# Patient Record
Sex: Male | Born: 1995 | Race: Black or African American | Hispanic: No | Marital: Single | Smoking: Never smoker
Health system: Southern US, Community
[De-identification: ages and names within clinical notes are randomized; demographics above are authoritative.]

## PROBLEM LIST (undated history)

## (undated) DIAGNOSIS — A048 Other specified bacterial intestinal infections: Secondary | ICD-10-CM

## (undated) DIAGNOSIS — U071 COVID-19: Secondary | ICD-10-CM

## (undated) HISTORY — DX: Other specified bacterial intestinal infections: A04.8

## (undated) HISTORY — PX: INGUINAL HERNIA REPAIR: SUR1180

---

## 2018-03-09 ENCOUNTER — Emergency Department (HOSPITAL_COMMUNITY): Payer: Self-pay

## 2018-03-09 ENCOUNTER — Other Ambulatory Visit: Payer: Self-pay

## 2018-03-09 ENCOUNTER — Emergency Department (HOSPITAL_COMMUNITY)
Admission: EM | Admit: 2018-03-09 | Discharge: 2018-03-09 | Disposition: A | Discharge status: Home / Self Care | Payer: Self-pay | Attending: Emergency Medicine | Admitting: Emergency Medicine

## 2018-03-09 ENCOUNTER — Encounter (HOSPITAL_COMMUNITY): Payer: Self-pay

## 2018-03-09 DIAGNOSIS — S0990XA Unspecified injury of head, initial encounter: Secondary | ICD-10-CM | POA: Insufficient documentation

## 2018-03-09 DIAGNOSIS — S20212A Contusion of left front wall of thorax, initial encounter: Secondary | ICD-10-CM | POA: Insufficient documentation

## 2018-03-09 DIAGNOSIS — Y9289 Other specified places as the place of occurrence of the external cause: Secondary | ICD-10-CM | POA: Insufficient documentation

## 2018-03-09 DIAGNOSIS — Y998 Other external cause status: Secondary | ICD-10-CM | POA: Insufficient documentation

## 2018-03-09 DIAGNOSIS — R071 Chest pain on breathing: Secondary | ICD-10-CM | POA: Insufficient documentation

## 2018-03-09 DIAGNOSIS — W11XXXA Fall on and from ladder, initial encounter: Secondary | ICD-10-CM | POA: Insufficient documentation

## 2018-03-09 DIAGNOSIS — R51 Headache: Secondary | ICD-10-CM | POA: Insufficient documentation

## 2018-03-09 DIAGNOSIS — Y93H9 Activity, other involving exterior property and land maintenance, building and construction: Secondary | ICD-10-CM | POA: Insufficient documentation

## 2018-03-09 MED ORDER — HYDROCODONE-ACETAMINOPHEN 5-325 MG PO TABS
2.0000 | ORAL_TABLET | Freq: Once | ORAL | Status: AC
  Administered 2018-03-09: 2 via ORAL
  Filled 2018-03-09: qty 2

## 2018-03-09 MED ORDER — MELOXICAM 7.5 MG PO TABS: 15.0000 mg | ORAL_TABLET | Freq: Every day | ORAL | 0 refills | Status: DC

## 2018-03-09 NOTE — ED Triage Notes (Addendum)
Pt states he fell off a 10+ foot tall ladder earlier today- he was rushing and missed a rung. Pt states he hit the right side of his face/head, and has left sided rib pain.

## 2018-03-09 NOTE — ED Notes (Signed)
Pt reports falling off of ladder and landed on left side with pain to left lateral rib area.

## 2018-03-09 NOTE — ED Provider Notes (Signed)
[facility]-EMERGENCY DEPT Provider Note   CSN: [id number] Arrival date & time: 03/09/18  [id number]     History   Chief Complaint Chief Complaint  Patient presents with  . Fall    HPI [name] is a 22 y.o. male.  HPI  The patient is a 22 year old male who was at work cleaning leaves out of the gutter when he had a fall on his ladder, he was trying to come down to quickly, Mr. rung and fell to the ground.  He states that he struck his left ribs as well as the right side of his head during the fall.  He did not lose consciousness, has not been nauseated or vomiting but did not immediately feel dizzy.  The dizziness has gone away and now he just has a diffuse headache.  He has pain with deep breathing in the left anterior lateral ribs.  He denies any abdominal pain and has no pain in his arms or legs.  The patient denies any changes in vision, no numbness or weakness, he is able to ambulate only with pain in the left ribs.  Symptoms are persistent, worse with palpation and breathing.  No associated hemoptysis  History reviewed. No pertinent past medical history.  There are no active problems to display for this patient.   History reviewed. No pertinent surgical history.      Home Medications    Prior to Admission medications   Medication Sig Start Date End Date Taking? Authorizing Provider  meloxicam (MOBIC) 7.5 MG tablet Take 2 tablets (15 mg total) by mouth daily. 03/09/18   Eber HongMiller, Rashauna Tep, MD    Family History No family history on file.  Social History Social History   Tobacco Use  . Smoking status: Never Smoker  . Smokeless tobacco: Never Used  Substance Use Topics  . Alcohol use: Yes    Comment: socially  . Drug use: Never     Allergies   Patient has no known allergies.   Review of Systems Review of Systems  All other systems reviewed and are negative.    Physical Exam Updated Vital Signs BP 112/75 (BP Location: Left Arm)   Pulse  71   Temp 98.6 F (37 C) (Oral)   Resp 18   Ht 5\' 9"  (1.753 m)   Wt 65.8 kg (145 lb)   SpO2 100%   BMI 21.41 kg/m   Physical Exam  Constitutional: He appears well-developed and well-nourished. No distress.  HENT:  Head: Normocephalic.  Mouth/Throat: Oropharynx is clear and moist. No oropharyngeal exudate.  Right side of the temporal region is swollen, there is tenderness there, there is no depression of the skull palpated.  Normal facial structure without malocclusion, there is no hemotympanum.  Eyes: Pupils are equal, round, and reactive to light. Conjunctivae and EOM are normal. Right eye exhibits no discharge. Left eye exhibits no discharge. No scleral icterus.  Neck: Normal range of motion. Neck supple. No JVD present. No thyromegaly present.  Cardiovascular: Normal rate, regular rhythm, normal heart sounds and intact distal pulses. Exam reveals no gallop and no friction rub.  No murmur heard. Pulmonary/Chest: Effort normal and breath sounds normal. No respiratory distress. He has no wheezes. He has no rales. He exhibits tenderness ( + rib ttp on the L side - no crepitance or sub Q emphysema).  Abdominal: Soft. Bowel sounds are normal. He exhibits no distension and no mass. There is no tenderness.  Musculoskeletal: Normal range of motion. He exhibits  no edema or tenderness.  Lymphadenopathy:    He has no cervical adenopathy.  Neurological: He is alert. Coordination normal.  Skin: Skin is warm and dry. No rash noted. No erythema.  Psychiatric: He has a normal mood and affect. His behavior is normal.  Nursing note and vitals reviewed.    ED Treatments / Results  Labs (all labs ordered are listed, but only abnormal results are displayed) Labs Reviewed - No data to display  EKG None  Radiology Dg Ribs Unilateral W/chest Left  Result Date: 03/09/2018 CLINICAL DATA:  Fall from ladder. EXAM: LEFT RIBS AND CHEST - 3+ VIEW COMPARISON:  None. FINDINGS: No fracture or other bone  lesions are seen involving the ribs. There is no evidence of pneumothorax or pleural effusion. Both lungs are clear. Heart size and mediastinal contours are within normal limits. IMPRESSION: No rib fracture. Electronically Signed   By: Deatra Robinson M.D.   On: 03/09/2018 20:38   Ct Head Wo Contrast  Result Date: 03/09/2018 CLINICAL DATA:  Fall from ladder EXAM: CT HEAD WITHOUT CONTRAST TECHNIQUE: Contiguous axial images were obtained from the base of the skull through the vertex without intravenous contrast. COMPARISON:  None. FINDINGS: Brain: There is no mass, hemorrhage or extra-axial collection. The size and configuration of the ventricles and extra-axial CSF spaces are normal. There is no acute or chronic infarction. The brain parenchyma is normal. Vascular: No abnormal hyperdensity of the major intracranial arteries or dural venous sinuses. No intracranial atherosclerosis. Skull: The visualized skull base, calvarium and extracranial soft tissues are normal. Sinuses/Orbits: No fluid levels or advanced mucosal thickening of the visualized paranasal sinuses. No mastoid or middle ear effusion. The orbits are normal. IMPRESSION: Normal brain.  No acute abnormality. Electronically Signed   By: Deatra Robinson M.D.   On: 03/09/2018 20:45    Procedures Procedures (including critical care time)  Medications Ordered in ED Medications  HYDROcodone-acetaminophen (NORCO/VICODIN) 5-325 MG per tablet 2 tablet (2 tablets Oral Given 03/09/18 2010)     Initial Impression / Assessment and Plan / ED Course  I have reviewed the triage vital signs and the nursing notes.  Pertinent labs & imaging results that were available during my care of the patient were reviewed by me and considered in my medical decision making (see chart for details).  Clinical Course as of Mar 09 2105  Sat Mar 09, 2018  2104 CT neg for ICH / fracture Rib looks good - no fractures Nsaids and home.   [BM]    Clinical Course User  Index [BM] Eber Hong, MD    The patient clearly is at risk for rib fractures but also for intracranial injury given the level of the fall, this would be classified as a significant injury requiring a CT scan.  This has been ordered, pain medication ordered.  Thankfully the patient's neurologic exam is normal and he has no neck pain.  Final Clinical Impressions(s) / ED Diagnoses   Final diagnoses:  Minor head injury, initial encounter  Contusion of rib on left side, initial encounter    ED Discharge Orders        Ordered    meloxicam (MOBIC) 7.5 MG tablet  Daily     03/09/18 2106       Eber Hong, MD 03/09/18 2106

## 2018-03-09 NOTE — Discharge Instructions (Signed)
YOur xrays are normal  No signs of fracture to the head / skull / ribs. Mobic twice daily for 14 days.

## 2018-09-04 ENCOUNTER — Encounter (HOSPITAL_COMMUNITY): Payer: Self-pay

## 2018-09-04 ENCOUNTER — Other Ambulatory Visit: Payer: Self-pay

## 2018-09-04 ENCOUNTER — Emergency Department (HOSPITAL_COMMUNITY): Payer: Self-pay

## 2018-09-04 ENCOUNTER — Emergency Department (HOSPITAL_COMMUNITY)
Admission: EM | Admit: 2018-09-04 | Discharge: 2018-09-04 | Disposition: A | Discharge status: Home / Self Care | Payer: Self-pay | Attending: Emergency Medicine | Admitting: Emergency Medicine

## 2018-09-04 DIAGNOSIS — L03012 Cellulitis of left finger: Secondary | ICD-10-CM | POA: Insufficient documentation

## 2018-09-04 DIAGNOSIS — Z79899 Other long term (current) drug therapy: Secondary | ICD-10-CM | POA: Insufficient documentation

## 2018-09-04 MED ORDER — LIDOCAINE HCL (PF) 1 % IJ SOLN: 10.0000 mL | Freq: Once | INTRAMUSCULAR | Status: DC

## 2018-09-04 MED ORDER — CEPHALEXIN 500 MG PO CAPS: 500.0000 mg | ORAL_CAPSULE | Freq: Four times a day (QID) | ORAL | 0 refills | Status: AC

## 2018-09-04 MED ORDER — ACETAMINOPHEN 500 MG PO TABS
1000.0000 mg | ORAL_TABLET | Freq: Once | ORAL | Status: AC
Start: 2018-09-04 — End: 2018-09-04
  Administered 2018-09-04: 1000 mg via ORAL
  Filled 2018-09-04: qty 2

## 2018-09-04 MED ORDER — LIDOCAINE HCL 1 % IJ SOLN
INTRAMUSCULAR | Status: AC
  Administered 2018-09-04: 20 mL
  Filled 2018-09-04: qty 20

## 2018-09-04 NOTE — Discharge Instructions (Signed)
You can take Tylenol or Ibuprofen as directed for pain. You can alternate Tylenol and Ibuprofen every 4 hours. If you take Tylenol at 1pm, then you can take Ibuprofen at 5pm. Then you can take Tylenol again at 9pm.   Take antibiotics as directed. Please take all of your antibiotics until finished.  As we discussed for the next 2 to 3 days, soak it in warm water for about 10 to 15 minutes to continue express drainage.  Keep the area clean and dry.  You can gently wash with soap and water.  Make sure it is nice and dry before he put a bandage on it.  Wet wounds can cause bacteria to grow.  Return the emergency department for any fever, worsening pain or swelling of the finger, any redness that extends on the finger or any other worsening or concerning symptoms.

## 2018-09-04 NOTE — ED Notes (Signed)
Bed: WTR5 Expected date:  Expected time:  Means of arrival:  Comments: Hold for deep cleaning

## 2018-09-04 NOTE — ED Triage Notes (Signed)
Patient c/o left middle finger pain at the last joint x 3 weeks. Patient states gradually getting worse.

## 2018-09-04 NOTE — ED Provider Notes (Signed)
[facility]-EMERGENCY DEPT Provider Note   CSN: [id number] Arrival date & time: 09/04/18  1552     History   Chief Complaint Chief Complaint  Patient presents with  . Hand Pain    HPI Shawn Barajas is a 23 y.o. male who presents for evaluation of pain, swelling, discoloration to the distal aspect of his left middle finger that is been ongoing for last 3 weeks.  Patient states that it worsened over the last few days and was not getting better, prompting ED visit.  Patient states that he is not known of any injury preceding the symptoms.  He states he has not had any fevers.  He reports pain is worsened with palpation of the finger.  Patient denies any numbness.  The history is provided by the patient.    History reviewed. No pertinent past medical history.  There are no active problems to display for this patient.   History reviewed. No pertinent surgical history.      Home Medications    Prior to Admission medications   Medication Sig Start Date End Date Taking? Authorizing Provider  cephALEXin (KEFLEX) 500 MG capsule Take 1 capsule (500 mg total) by mouth 4 (four) times daily for 5 days. 09/04/18 09/09/18  Maxwell CaulLayden, Hendryx Ricke A, PA-C  meloxicam (MOBIC) 7.5 MG tablet Take 2 tablets (15 mg total) by mouth daily. 03/09/18   Eber HongMiller, Brian, MD    Family History History reviewed. No pertinent family history.  Social History Social History   Tobacco Use  . Smoking status: Never Smoker  . Smokeless tobacco: Never Used  Substance Use Topics  . Alcohol use: Yes    Comment: socially  . Drug use: Never     Allergies   Patient has no known allergies.   Review of Systems Review of Systems  Constitutional: Negative for fever.  Musculoskeletal:       Left middle finger pain  Skin: Positive for color change.  Neurological: Negative for numbness.  All other systems reviewed and are negative.    Physical Exam Updated Vital Signs BP 134/86 (BP  Location: Left Arm)   Pulse 74   Temp 98.4 F (36.9 C) (Oral)   Resp 18   Ht 5\' 9"  (1.753 m)   Wt 65.8 kg   SpO2 100%   BMI 21.41 kg/m   Physical Exam Vitals signs and nursing note reviewed.  Constitutional:      Appearance: He is well-developed.  HENT:     Head: Normocephalic and atraumatic.  Eyes:     General: No scleral icterus.       Right eye: No discharge.        Left eye: No discharge.     Conjunctiva/sclera: Conjunctivae normal.  Cardiovascular:     Pulses:          Radial pulses are 2+ on the right side and 2+ on the left side.  Pulmonary:     Effort: Pulmonary effort is normal.  Musculoskeletal:     Comments: Tenderness palpation at the distal aspect of the left middle finger.  No tenderness along the flexor or extensor tendon.  Flexion/tension of left middle finger intact without any difficulty.  Skin:    General: Skin is warm and dry.     Capillary Refill: Capillary refill takes less than 2 seconds.     Comments: Swelling, fluctuance noted to the lateral proximal nailbed consistent with paronychia.  It does not extend to the finger pad. Good distal cap  refill.  LUE is not dusky in appearance or cool to touch.  Neurological:     Mental Status: He is alert.  Psychiatric:        Speech: Speech normal.        Behavior: Behavior normal.      ED Treatments / Results  Labs (all labs ordered are listed, but only abnormal results are displayed) Labs Reviewed - No data to display  EKG None  Radiology Dg Finger Middle Left  Result Date: 09/04/2018 CLINICAL DATA:  Left middle finger swelling, pain EXAM: LEFT MIDDLE FINGER 2+V COMPARISON:  None. FINDINGS: Soft tissue swelling at the tip of the left middle finger. No bony abnormality. No fracture, subluxation or dislocation. Joint spaces maintained. No radiopaque foreign body. IMPRESSION: No bony abnormality. Electronically Signed   By: Charlett NoseKevin  Dover M.D.   On: 09/04/2018 16:46    Procedures .Marland Kitchen.Incision and  Drainage Date/Time: 09/04/2018 6:17 PM Performed by: Maxwell CaulLayden, Mikaili Flippin A, PA-C Authorized by: Maxwell CaulLayden, Azhar Yogi A, PA-C   Consent:    Consent obtained:  Verbal   Consent given by:  Patient   Risks discussed:  Bleeding, incomplete drainage, pain and damage to other organs   Alternatives discussed:  No treatment Universal protocol:    Procedure explained and questions answered to patient or proxy's satisfaction: yes     Relevant documents present and verified: yes     Test results available and properly labeled: yes     Imaging studies available: yes     Required blood products, implants, devices, and special equipment available: yes     Site/side marked: yes     Immediately prior to procedure a time out was called: yes     Patient identity confirmed:  Verbally with patient Location:    Indications for incision and drainage: Paronychia.   Location:  Upper extremity   Upper extremity location:  Finger   Finger location:  L long finger Pre-procedure details:    Skin preparation:  Betadine Anesthesia (see MAR for exact dosages):    Anesthesia method:  Local infiltration   Local anesthetic:  Lidocaine 1% WITH epi Procedure type:    Complexity:  Complex Procedure details:    Incision types:  Single straight   Incision depth:  Subcutaneous   Scalpel blade:  11   Wound management:  Probed and deloculated, irrigated with saline and extensive cleaning   Drainage:  Purulent   Drainage amount:  Moderate   Wound treatment:  Wound left open Post-procedure details:    Patient tolerance of procedure:  Tolerated well, no immediate complications   (including critical care time)  Medications Ordered in ED Medications  lidocaine (PF) (XYLOCAINE) 1 % injection 10 mL (has no administration in time range)  lidocaine (XYLOCAINE) 1 % (with pres) injection (20 mLs  Given 09/04/18 1847)  acetaminophen (TYLENOL) tablet 1,000 mg (1,000 mg Oral Given 09/04/18 1853)     Initial Impression / Assessment  and Plan / ED Course  I have reviewed the triage vital signs and the nursing notes.  Pertinent labs & imaging results that were available during my care of the patient were reviewed by me and considered in my medical decision making (see chart for details).     23 year old male who presents for evaluation of 2 to 3 weeks of left third digit pain, swelling toward the distal end.  He reports that he did not have any preceding trauma, injury, fall. Patient is afebrile, non-toxic appearing, sitting comfortably on examination table. Vital  signs reviewed and stable. Patient is neurovascularly intact.  Patient with pain noted to the distal aspect of the left third finger at the proximal and lateral nail bed.  There is some surrounding soft tissue swelling, erythema, fluctuance.  No tenderness palpation along the flexor or extensor tendon.  Concern for paronychia.  No evidence of felon.  History/physical exam is not concerning for flexor tenosynovitis.  We will plan for drainage.  Drainage of paronychia as documented above.  Patient tolerated procedure well.  Encourage at home supportive care measures. At this time, patient exhibits no emergent life-threatening condition that require further evaluation in ED or admission. Patient had ample opportunity for questions and discussion. All patient's questions were answered with full understanding. Strict return precautions discussed. Patient expresses understanding and agreement to plan.   Portions of this note were generated with Scientist, clinical (histocompatibility and immunogenetics). Dictation errors may occur despite best attempts at proofreading.   Final Clinical Impressions(s) / ED Diagnoses   Final diagnoses:  Paronychia of finger of left hand    ED Discharge Orders         Ordered    cephALEXin (KEFLEX) 500 MG capsule  4 times daily     09/04/18 1835           Rosana Hoes 09/04/18 2048    Benjiman Core, MD 09/04/18 2221

## 2019-01-08 ENCOUNTER — Emergency Department (HOSPITAL_BASED_OUTPATIENT_CLINIC_OR_DEPARTMENT_OTHER)
Admission: EM | Admit: 2019-01-08 | Discharge: 2019-01-08 | Disposition: A | Discharge status: Home / Self Care | Payer: Self-pay | Attending: Emergency Medicine | Admitting: Emergency Medicine

## 2019-01-08 ENCOUNTER — Other Ambulatory Visit: Payer: Self-pay

## 2019-01-08 ENCOUNTER — Encounter (HOSPITAL_BASED_OUTPATIENT_CLINIC_OR_DEPARTMENT_OTHER): Payer: Self-pay

## 2019-01-08 DIAGNOSIS — S39012A Strain of muscle, fascia and tendon of lower back, initial encounter: Secondary | ICD-10-CM | POA: Insufficient documentation

## 2019-01-08 DIAGNOSIS — Y999 Unspecified external cause status: Secondary | ICD-10-CM | POA: Insufficient documentation

## 2019-01-08 DIAGNOSIS — Y92410 Unspecified street and highway as the place of occurrence of the external cause: Secondary | ICD-10-CM | POA: Insufficient documentation

## 2019-01-08 DIAGNOSIS — Y9389 Activity, other specified: Secondary | ICD-10-CM | POA: Insufficient documentation

## 2019-01-08 DIAGNOSIS — S161XXA Strain of muscle, fascia and tendon at neck level, initial encounter: Secondary | ICD-10-CM | POA: Insufficient documentation

## 2019-01-08 MED ORDER — CYCLOBENZAPRINE HCL 10 MG PO TABS: 10.0000 mg | ORAL_TABLET | Freq: Two times a day (BID) | ORAL | 0 refills | Status: DC | PRN
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
## 2019-01-08 NOTE — Discharge Instructions (Addendum)
You were evaluated in the Emergency Department and after careful evaluation, we did not find any emergent condition requiring admission or further testing in the hospital.  Your symptoms today seem to be due to strain and spasm of the muscles of the neck and back.  Your exam was otherwise reassuring.  Please use Tylenol or ibuprofen during the day for discomfort.  You can use the muscle relaxers provided the night if the pain is keeping you from sleeping.  Please return to the Emergency Department if you experience any worsening of your condition.  We encourage you to follow up with a primary care provider.  Thank you for allowing [place] to be a part of your care.

## 2019-06-20 IMAGING — CR DG RIBS W/ CHEST 3+V*L*
3 series · 3 of 3 positions shown · non-contrast
Comparison: None.

CLINICAL DATA: Fall from ladder.

EXAM:
LEFT RIBS AND CHEST - 3+ VIEW

[w chest pa]
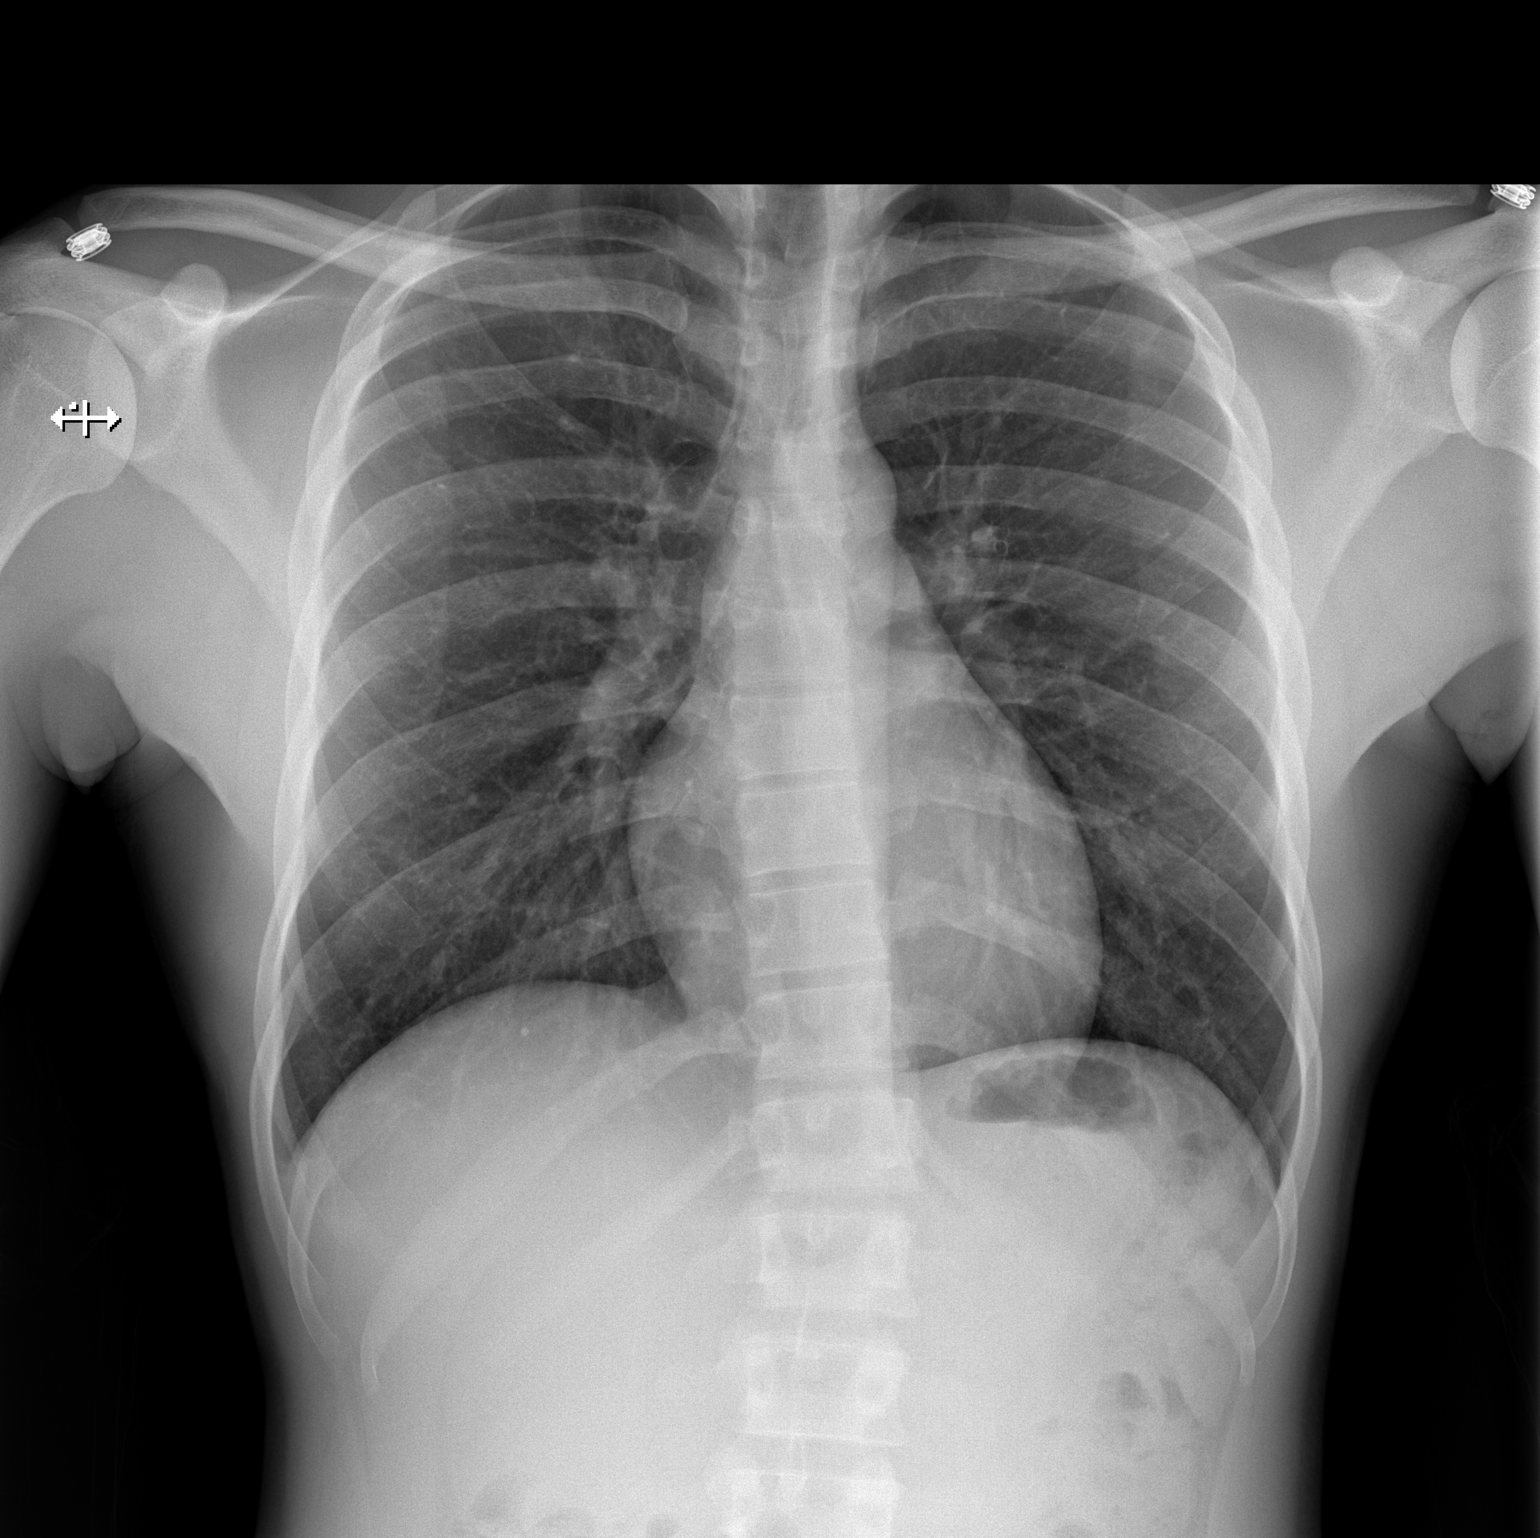

[w ribs ap upper left]
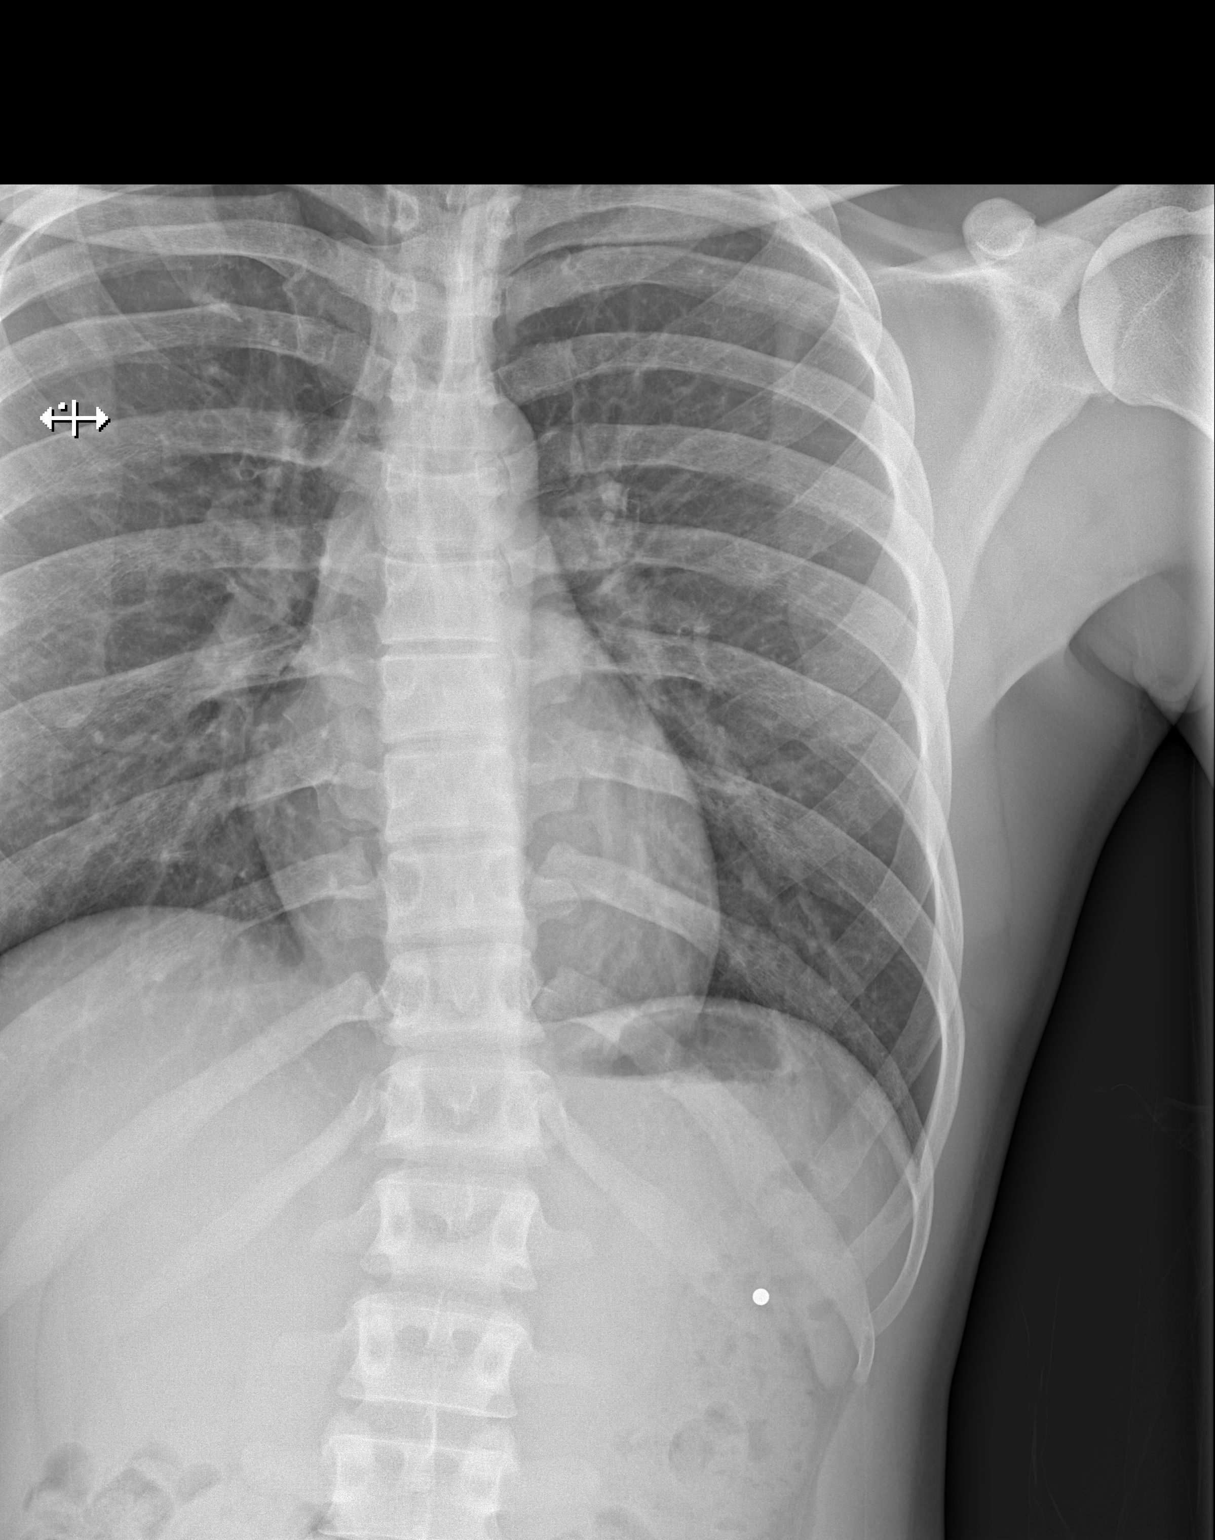

[w ribs obl left]
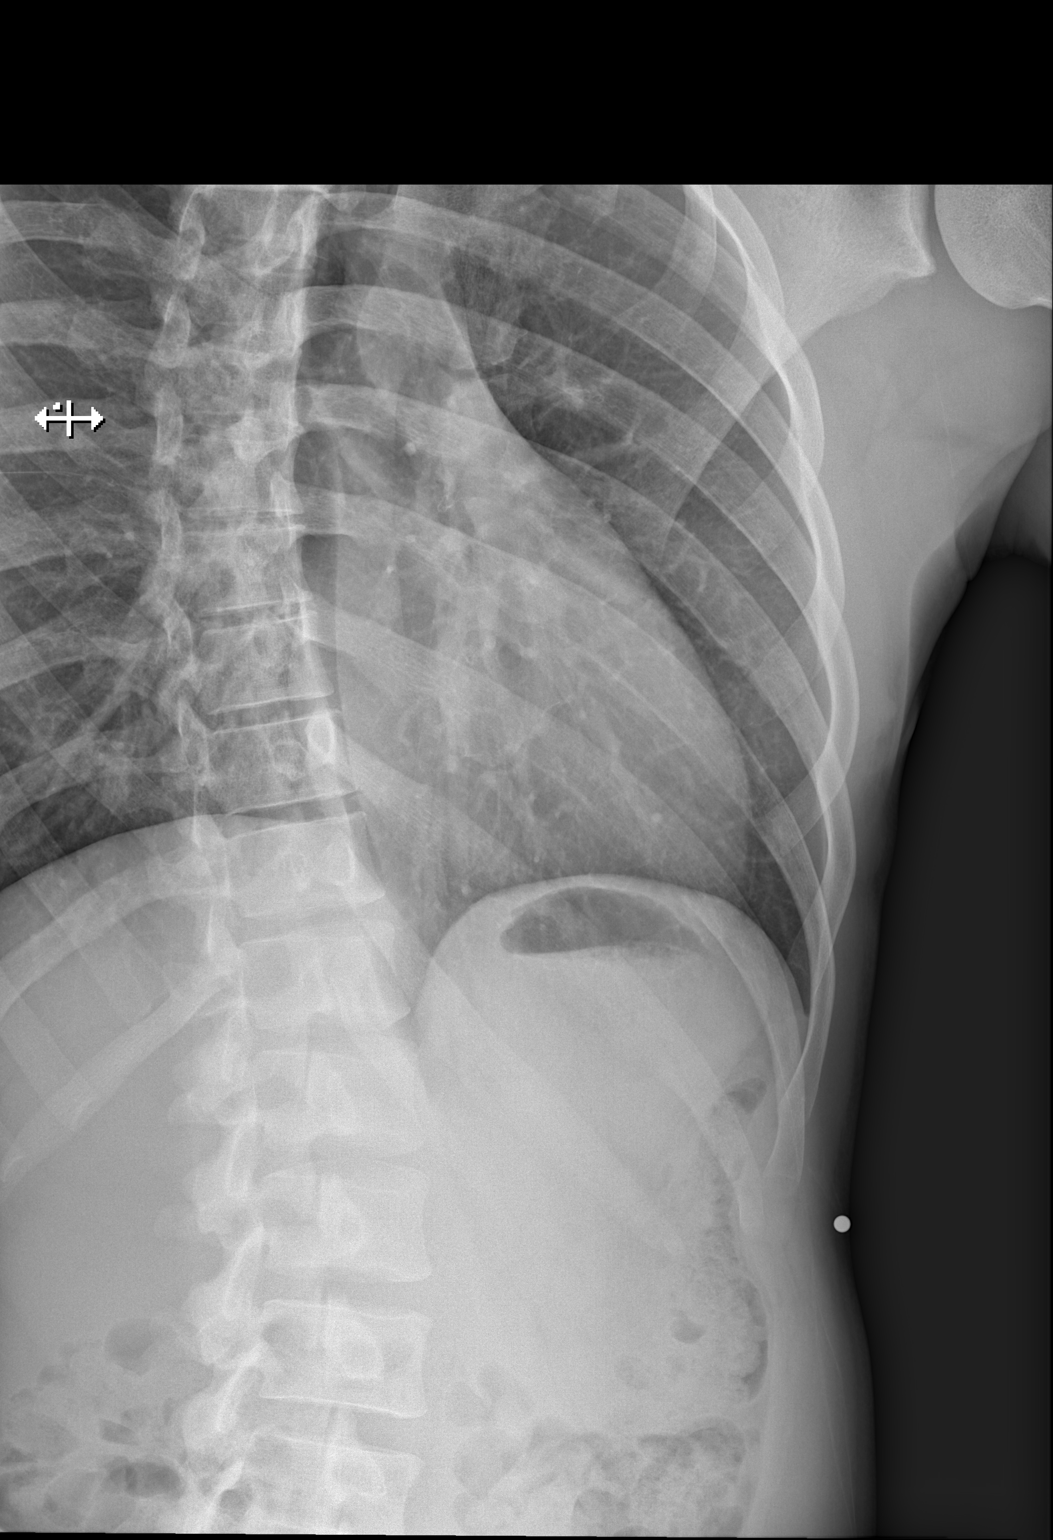

[3 of 3 positions shown; findings below may reference images not displayed]

FINDINGS: No fracture or other bone lesions are seen involving the ribs. There
is no evidence of pneumothorax or pleural effusion. Both lungs are
clear. Heart size and mediastinal contours are within normal limits.
IMPRESSION: No rib fracture.

## 2019-10-29 ENCOUNTER — Emergency Department (HOSPITAL_BASED_OUTPATIENT_CLINIC_OR_DEPARTMENT_OTHER)
Admission: EM | Admit: 2019-10-29 | Discharge: 2019-10-29 | Disposition: A | Discharge status: Home / Self Care | Payer: Self-pay | Attending: Emergency Medicine | Admitting: Emergency Medicine

## 2019-10-29 ENCOUNTER — Other Ambulatory Visit: Payer: Self-pay

## 2019-10-29 ENCOUNTER — Encounter (HOSPITAL_BASED_OUTPATIENT_CLINIC_OR_DEPARTMENT_OTHER): Payer: Self-pay | Admitting: Emergency Medicine

## 2019-10-29 DIAGNOSIS — K4090 Unilateral inguinal hernia, without obstruction or gangrene, not specified as recurrent: Secondary | ICD-10-CM | POA: Insufficient documentation

## 2019-10-29 NOTE — ED Triage Notes (Signed)
Pt states he has swelling to his left groin  Pt states it has been there for two years  Pt states it has gradually gotten bigger

## 2019-10-29 NOTE — Discharge Instructions (Addendum)
Call [facility] to arrange a follow-up appointment.  Their contact information has been provided in this discharge summary for you to call and make these arrangements.  Return to the emergency department if you develop worsening pain, high fever, vomiting, or other new and concerning symptoms.

## 2019-10-29 NOTE — ED Provider Notes (Signed)
[place] EMERGENCY DEPARTMENT Provider Note   CSN: [id number] Arrival date & time: 10/29/19  2005     History Chief Complaint  Patient presents with  . Groin Swelling    Shawn Barajas is a 24 y.o. male.  Patient is a 24 year old male with no significant past medical history.  He presents today with complaints of a swollen area to his left groin.  Patient states for the past 2 years he has had an area in the upper portion of his left testicle that has intermittently swelled up, then gone down in size.  He denies any pain, fever, nausea, or vomiting.  The history is provided by the patient.       History reviewed. No pertinent past medical history.  There are no problems to display for this patient.   History reviewed. No pertinent surgical history.     History reviewed. No pertinent family history.  Social History   Tobacco Use  . Smoking status: Never Smoker  . Smokeless tobacco: Never Used  Substance Use Topics  . Alcohol use: Yes    Comment: social   . Drug use: Never    Home Medications Prior to Admission medications   Medication Sig Start Date End Date Taking? Authorizing Provider  cyclobenzaprine (FLEXERIL) 10 MG tablet Take 1 tablet (10 mg total) by mouth 2 (two) times daily as needed for muscle spasms. 01/08/19   Maudie Flakes, MD  meloxicam (MOBIC) 7.5 MG tablet Take 2 tablets (15 mg total) by mouth daily. 03/09/18   Noemi Chapel, MD    Allergies    Patient has no known allergies.  Review of Systems   Review of Systems  All other systems reviewed and are negative.   Physical Exam Updated Vital Signs BP 120/71 (BP Location: Right Arm)   Pulse 66   Temp 98.5 F (36.9 C) (Oral)   Resp 14   Ht 5\' 9"  (1.753 m)   Wt 64.9 kg   SpO2 96%   BMI 21.12 kg/m   Physical Exam Vitals and nursing note reviewed.  Constitutional:      General: He is not in acute distress.    Appearance: Normal appearance. He is not ill-appearing.  HENT:       Head: Normocephalic and atraumatic.  Abdominal:     Comments: There is an obvious left-sided inguinal hernia present.  It is easily reducible and nontender to the touch.  Skin:    General: Skin is warm and dry.  Neurological:     General: No focal deficit present.     Mental Status: He is alert and oriented to person, place, and time.     ED Results / Procedures / Treatments   Labs (all labs ordered are listed, but only abnormal results are displayed) Labs Reviewed - No data to display  EKG None  Radiology No results found.  Procedures Procedures (including critical care time)  Medications Ordered in ED Medications - No data to display  ED Course  I have reviewed the triage vital signs and the nursing notes.  Pertinent labs & imaging results that were available during my care of the patient were reviewed by me and considered in my medical decision making (see chart for details).    MDM Rules/Calculators/A&P  Patient with an easily reducible left inguinal hernia.  There is no  incarceration or strangulation.  Patient stable for discharge.  He will be given the follow-up information for at [facility] surgery.  He is  to call to arrange an appointment and return if he develops pain, fever, vomiting, or other new and concerning symptoms.  Final Clinical Impression(s) / ED Diagnoses Final diagnoses:  None    Rx / DC Orders ED Discharge Orders    None       Geoffery Lyons, MD 10/29/19 2033

## 2019-11-28 ENCOUNTER — Ambulatory Visit: Payer: Self-pay | Admitting: Surgery

## 2019-11-28 NOTE — H&P (Signed)
  History of Present Illness Shawn Barajas. Shawn Bartnick MD; 11/28/2019 11:54 AM) The patient is a 24 year old male who presents with an inguinal hernia. Referred by Dr. Emily Filbert for [facility]  This is a 24 year old male in good health who presents with a two-year history of an enlarging left inguinal hernia. The patient works [employer]. He does not remember the initiating event. The hernia does remained reducible. He denies any constipation but does notice that he goes to the bathroom more often than usual. He also feels a lot of generalized abdominal bloating and tightness. No nausea or vomiting. Examination in the emergency department and was noted to have a large reducible left inguinal hernia. He is now referred to our office for management.   Problem List/Past Medical Shawn Hazard K. Ova Gillentine, MD; 11/28/2019 11:54 AM) INGUINAL HERNIA OF LEFT SIDE WITHOUT OBSTRUCTION OR GANGRENE (K40.90)  Past Surgical History Shawn Bickers, LPN; 01/21/2296 98:92 AM) No pertinent past surgical history  Allergies Shawn Bickers, LPN; 08/22/9415 40:81 AM) No Known Drug Allergies [11/28/2019]: Allergies Reconciled  Medication History Shawn Bickers, LPN; 11/23/8183 63:14 AM) No Current Medications Medications Reconciled  Other Problems Shawn Hazard K. Aracelly Tencza, MD; 11/28/2019 11:54 AM) Back Pain Gastroesophageal Reflux Disease Inguinal Hernia     Review of Systems Shawn Endo [facility] LPN; 04/27/262 78:58 AM) Gastrointestinal Present- Abdominal Pain, Bloating and Excessive gas. Not Present- Bloody Stool, Change in Bowel Habits, Chronic diarrhea, Constipation, Difficulty Swallowing, Gets full quickly at meals, Hemorrhoids, Indigestion, Nausea, Rectal Pain and Vomiting.  Vitals Shawn Endo [facility] LPN; 03/25/276 41:28 AM) 11/28/2019 11:21 AM Weight: 145 lb Temp.: 25F(Thermal Scan)  Pulse: 92 (Regular)  BP: 120/76 (Sitting, Right Arm, Standard)        Physical Exam Shawn Hazard K. Abbe Bula MD; 11/28/2019 11:55 AM)  The  physical exam findings are as follows: Note:Constitutional: WDWN in NAD, conversant, no obvious deformities; Eyes: Pupils equal, round; sclera anicteric; moist conjunctiva; no lid lag HENT: Oral mucosa moist; good dentition Neck: No masses palpated, trachea midline; no thyromegaly Lungs: CTA bilaterally; normal respiratory effort CV: Regular rate and rhythm; no murmurs; extremities well-perfused with no edema Abd: +bowel sounds, soft, non-tender, no palpable organomegaly; small asymptomatic umbilical hernia GU: bilateral descended testes; no testicular masses; large left inguinal hernia extending down into the scrotum - easily reducible; no sign of RIH Musc: Normal gait; no apparent clubbing or cyanosis in extremities Lymphatic: No palpable cervical or axillary lymphadenopathy Skin: Warm, dry; no sign of jaundice Psychiatric - alert and oriented x 4; calm mood and affect    Assessment & Plan Shawn Hazard K. Loyda Costin MD; 11/28/2019 11:48 AM)  INGUINAL HERNIA OF LEFT SIDE WITHOUT OBSTRUCTION OR GANGRENE (K40.90)  Current Plans Schedule for Surgery - Left inguinal hernia repair with mesh. The surgical procedure has been discussed with the patient. Potential risks, benefits, alternative treatments, and expected outcomes have been explained. All of the patient's questions at this time have been answered. The likelihood of reaching the patient's treatment goal is good. The patient understand the proposed surgical procedure and wishes to proceed. Pt Education - Shawn Barajas - Hernia Surgery: discussed with patient and provided information.  Shawn Barajas. Corliss Skains, MD, [facility] Surgery  General/ Trauma Surgery   11/28/2019 11:55 AM

## 2020-08-08 ENCOUNTER — Emergency Department (HOSPITAL_BASED_OUTPATIENT_CLINIC_OR_DEPARTMENT_OTHER)
Admission: EM | Admit: 2020-08-08 | Discharge: 2020-08-08 | Disposition: A | Discharge status: Home / Self Care | Payer: Self-pay | Attending: Emergency Medicine | Admitting: Emergency Medicine

## 2020-08-08 ENCOUNTER — Other Ambulatory Visit: Payer: Self-pay

## 2020-08-08 ENCOUNTER — Encounter (HOSPITAL_BASED_OUTPATIENT_CLINIC_OR_DEPARTMENT_OTHER): Payer: Self-pay | Admitting: Emergency Medicine

## 2020-08-08 DIAGNOSIS — U071 COVID-19: Secondary | ICD-10-CM | POA: Insufficient documentation

## 2020-08-08 HISTORY — DX: COVID-19: U07.1

## 2020-08-08 MED ORDER — FLUTICASONE PROPIONATE 50 MCG/ACT NA SUSP: 1.0000 | Freq: Every day | NASAL | 0 refills | Status: DC

## 2020-08-08 MED ORDER — BENZONATATE 100 MG PO CAPS: 100.0000 mg | ORAL_CAPSULE | Freq: Three times a day (TID) | ORAL | 0 refills | Status: DC

## 2020-08-08 MED ORDER — ONDANSETRON 4 MG PO TBDP: 4.0000 mg | ORAL_TABLET | Freq: Three times a day (TID) | ORAL | 0 refills | Status: DC | PRN

## 2020-08-08 MED ORDER — NAPROXEN 500 MG PO TABS: 500.0000 mg | ORAL_TABLET | Freq: Two times a day (BID) | ORAL | 0 refills | Status: DC | PRN

## 2020-08-08 NOTE — Discharge Instructions (Addendum)
You were seen in the emergency department today for symptoms of COVID-19 We are sending you with the following medicines to help with your symptoms: -Flonase: Use 1 spray per nostril daily as needed for congestion/sore throat itchiness -Tessalon: Take every 8 hours as needed for coughing -Zofran: Take every hours as needed for nausea and vomiting - Naproxen: this is a nonsteroidal anti-inflammatory medication that will help with pain and swelling. Be sure to take this medication as prescribed with food, 1 pill every 12 hours,  It should be taken with food, as it can cause stomach upset, and more seriously, stomach bleeding. Do not take other nonsteroidal anti-inflammatory medications with this such as Advil, Motrin, Aleve, Mobic, Goodie Powder, or Motrin.    You make take Tylenol per over the counter dosing with these medications.   We have prescribed you new medication(s) today. Discuss the medications prescribed today with your pharmacist as they can have adverse effects and interactions with your other medicines including over the counter and prescribed medications. Seek medical evaluation if you start to experience new or abnormal symptoms after taking one of these medicines, seek care immediately if you start to experience difficulty breathing, feeling of your throat closing, facial swelling, or rash as these could be indications of a more serious allergic reaction  We are instructing patient's with COVID 19 or symptoms of COVID 19 to isolate themselves for 14 days. You may be able to discontinue self isolation if the following conditions are met:   Persons with COVID-19 who have symptoms and were directed to care for themselves at home may discontinue home isolation under the  following conditions: - It has been at least 7 days have passed since symptoms first appeared. - AND at least 3 days (72 hours) have passed since recovery defined as resolution of fever without the use of fever-reducing  medications and improvement in respiratory symptoms (e.g., cough, shortness of breath)  Please follow the below instructions.   Please follow up with primary care within 3-5 days for re-evaluation- call prior to going to the office to make them aware of your symptoms as some offices are altering their method of seeing patients with COVID 19 symptoms, we have also provided our [facility] covid clinic for follow up as well.  Return to the ER for new or worsening symptoms including but not limited to increased work of breathing, chest pain, passing out, or any other concerns.       Person Under Monitoring Name: Shawn Barajas  Location: [address] [place] [address]   Infection Prevention Recommendations for Individuals Confirmed to have, or Being Evaluated for, 2019 Novel Coronavirus (COVID-19) Infection Who Receive Care at Home  Individuals who are confirmed to have, or are being evaluated for, COVID-19 should follow the prevention steps below until a healthcare provider or local or state health department says they can return to normal activities.  Stay home except to get medical care You should restrict activities outside your home, except for getting medical care. Do not go to work, school, or public areas, and do not use public transportation or taxis.  Call ahead before visiting your doctor Before your medical appointment, call the healthcare provider and tell them that you have, or are being evaluated for, COVID-19 infection. This will help the healthcare provider's office take steps to keep other people from getting infected. Ask your healthcare provider to call the local or state health department.  Monitor your symptoms Seek prompt medical attention if  your illness is worsening (e.g., difficulty breathing). Before going to your medical appointment, call the healthcare provider and tell them that you have, or are being evaluated for, COVID-19 infection. Ask your  healthcare provider to call the local or state health department.  Wear a facemask You should wear a facemask that covers your nose and mouth when you are in the same room with other people and when you visit a healthcare provider. People who live with or visit you should also wear a facemask while they are in the same room with you.  Separate yourself from other people in your home As much as possible, you should stay in a different room from other people in your home. Also, you should use a separate bathroom, if available.  Avoid sharing household items You should not share dishes, drinking glasses, cups, eating utensils, towels, bedding, or other items with other people in your home. After using these items, you should wash them thoroughly with soap and water.  Cover your coughs and sneezes Cover your mouth and nose with a tissue when you cough or sneeze, or you can cough or sneeze into your sleeve. Throw used tissues in a lined trash can, and immediately wash your hands with soap and water for at least 20 seconds or use an alcohol-based hand rub.  Wash your [facility] your hands often and thoroughly with soap and water for at least 20 seconds. You can use an alcohol-based hand sanitizer if soap and water are not available and if your hands are not visibly dirty. Avoid touching your eyes, nose, and mouth with unwashed hands.   Prevention Steps for Caregivers and Household Members of Individuals Confirmed to have, or Being Evaluated for, COVID-19 Infection Being Cared for in the Home  If you live with, or provide care at home for, a person confirmed to have, or being evaluated for, COVID-19 infection please follow these guidelines to prevent infection:  Follow healthcare provider's instructions Make sure that you understand and can help the patient follow any healthcare provider instructions for all care.  Provide for the patient's basic needs You should help the patient with  basic needs in the home and provide support for getting groceries, prescriptions, and other personal needs.  Monitor the patient's symptoms If they are getting sicker, call his or her medical provider and tell them that the patient has, or is being evaluated for, COVID-19 infection. This will help the healthcare provider's office take steps to keep other people from getting infected. Ask the healthcare provider to call the local or state health department.  Limit the number of people who have contact with the patient If possible, have only one caregiver for the patient. Other household members should stay in another home or place of residence. If this is not possible, they should stay in another room, or be separated from the patient as much as possible. Use a separate bathroom, if available. Restrict visitors who do not have an essential need to be in the home.  Keep older adults, very young children, and other sick people away from the patient Keep older adults, very young children, and those who have compromised immune systems or chronic health conditions away from the patient. This includes people with chronic heart, lung, or kidney conditions, diabetes, and cancer.  Ensure good ventilation Make sure that shared spaces in the home have good air flow, such as from an air conditioner or an opened window, weather permitting.  Wash your hands often Wash  your hands often and thoroughly with soap and water for at least 20 seconds. You can use an alcohol based hand sanitizer if soap and water are not available and if your hands are not visibly dirty. Avoid touching your eyes, nose, and mouth with unwashed hands. Use disposable paper towels to dry your hands. If not available, use dedicated cloth towels and replace them when they become wet.  Wear a facemask and gloves Wear a disposable facemask at all times in the room and gloves when you touch or have contact with the patient's blood, body  fluids, and/or secretions or excretions, such as sweat, saliva, sputum, nasal mucus, vomit, urine, or feces.  Ensure the mask fits over your nose and mouth tightly, and do not touch it during use. Throw out disposable facemasks and gloves after using them. Do not reuse. Wash your hands immediately after removing your facemask and gloves. If your personal clothing becomes contaminated, carefully remove clothing and launder. Wash your hands after handling contaminated clothing. Place all used disposable facemasks, gloves, and other waste in a lined container before disposing them with other household waste. Remove gloves and wash your hands immediately after handling these items.  Do not share dishes, glasses, or other household items with the patient Avoid sharing household items. You should not share dishes, drinking glasses, cups, eating utensils, towels, bedding, or other items with a patient who is confirmed to have, or being evaluated for, COVID-19 infection. After the person uses these items, you should wash them thoroughly with soap and water.  Wash laundry thoroughly Immediately remove and wash clothes or bedding that have blood, body fluids, and/or secretions or excretions, such as sweat, saliva, sputum, nasal mucus, vomit, urine, or feces, on them. Wear gloves when handling laundry from the patient. Read and follow directions on labels of laundry or clothing items and detergent. In general, wash and dry with the warmest temperatures recommended on the label.  Clean all areas the individual has used often Clean all touchable surfaces, such as counters, tabletops, doorknobs, bathroom fixtures, toilets, phones, keyboards, tablets, and bedside tables, every day. Also, clean any surfaces that may have blood, body fluids, and/or secretions or excretions on them. Wear gloves when cleaning surfaces the patient has come in contact with. Use a diluted bleach solution (e.g., dilute bleach with 1  part bleach and 10 parts water) or a household disinfectant with a label that says EPA-registered for coronaviruses. To make a bleach solution at home, add 1 tablespoon of bleach to 1 quart (4 cups) of water. For a larger supply, add  cup of bleach to 1 gallon (16 cups) of water. Read labels of cleaning products and follow recommendations provided on product labels. Labels contain instructions for safe and effective use of the cleaning product including precautions you should take when applying the product, such as wearing gloves or eye protection and making sure you have good ventilation during use of the product. Remove gloves and wash hands immediately after cleaning.  Monitor yourself for signs and symptoms of illness Caregivers and household members are considered close contacts, should monitor their health, and will be asked to limit movement outside of the home to the extent possible. Follow the monitoring steps for close contacts listed on the symptom monitoring form.   ? If you have additional questions, contact your local health department or call the epidemiologist on call at [phone] (available 24/7). ? This guidance is subject to change. For the most up-to-date guidance from [facility], please  refer to their website: [email or web address]

## 2020-08-08 NOTE — ED Triage Notes (Signed)
Pt tested positive for COVID on Tuesday. Pt continues to have symptoms of hot flash, cough, weakness, body ache, nosebleed, headache. Pt has not been vaccinated against COVID.

## 2020-08-08 NOTE — ED Provider Notes (Signed)
[facility] EMERGENCY DEPARTMENT Provider Note   CSN: [id number] Arrival date & time: 08/08/20  1247     History Chief Complaint  Patient presents with  . Covid Positive    Shawn Barajas is a 24 y.o. male without significant past medical hx who presents to the ED with complaints of COVID sxs x 6 days. Patient reports sxs including nasal congestion, itchy/sore throat, cough productive of phlegm sputum, fever, chills, body aches, nausea, chest pain with coughing, and dyspnea intermittently. No alleviating/aggravating factors. Taking Robitussin without much relief. Has not received covid vaccinations. Had at home positive covid test day of onset of sxs. Denies syncope, hemoptysis, unilateral leg pain/swelling, or prior VTE/cancer.   HPI     Past Medical History:  Diagnosis Date  . COVID     There are no problems to display for this patient.   History reviewed. No pertinent surgical history.     No family history on file.  Social History   Tobacco Use  . Smoking status: Never Smoker  . Smokeless tobacco: Never Used  Vaping Use  . Vaping Use: Never used  Substance Use Topics  . Alcohol use: Not Currently    Comment: social   . Drug use: Never    Home Medications Prior to Admission medications   Medication Sig Start Date End Date Taking? Authorizing Provider  cyclobenzaprine (FLEXERIL) 10 MG tablet Take 1 tablet (10 mg total) by mouth 2 (two) times daily as needed for muscle spasms. 01/08/19   Sabas Sous, MD  meloxicam (MOBIC) 7.5 MG tablet Take 2 tablets (15 mg total) by mouth daily. 03/09/18   Eber Hong, MD    Allergies    Patient has no known allergies.  Review of Systems   Review of Systems  Constitutional: Negative for chills and fever.  HENT: Positive for congestion and sore throat. Negative for ear pain.   Respiratory: Positive for cough and shortness of breath.   Cardiovascular: Positive for chest pain. Negative for leg swelling.   Gastrointestinal: Positive for nausea. Negative for abdominal pain, blood in stool and constipation.  Genitourinary: Negative for dysuria.  Neurological: Negative for syncope.  All other systems reviewed and are negative.   Physical Exam Updated Vital Signs BP 122/75 (BP Location: Right Arm)   Pulse 72   Temp 99.1 F (37.3 C) (Oral)   Resp 20   Ht 5\' 9"  (1.753 m)   Wt 65.4 kg   SpO2 98%   BMI 21.28 kg/m   Physical Exam Vitals and nursing note reviewed.  Constitutional:      General: He is not in acute distress.    Appearance: He is well-developed. He is not toxic-appearing.  HENT:     Head: Normocephalic and atraumatic.     Right Ear: Ear canal normal. Tympanic membrane is not perforated, erythematous, retracted or bulging.     Left Ear: Ear canal normal. Tympanic membrane is not perforated, erythematous, retracted or bulging.     Ears:     Comments: No mastoid erythema/swellng/tenderness.     Nose: Congestion present.     Right Sinus: No maxillary sinus tenderness or frontal sinus tenderness.     Left Sinus: No maxillary sinus tenderness or frontal sinus tenderness.     Mouth/Throat:     Pharynx: Oropharynx is clear. Uvula midline. Posterior oropharyngeal erythema (mild) present. No oropharyngeal exudate.     Comments: Posterior oropharynx is symmetric appearing. Patient tolerating own secretions without difficulty. No trismus. No drooling.  No hot potato voice. No swelling beneath the tongue, submandibular compartment is soft.  Eyes:     General:        Right eye: No discharge.        Left eye: No discharge.     Conjunctiva/sclera: Conjunctivae normal.  Cardiovascular:     Rate and Rhythm: Normal rate and regular rhythm.  Pulmonary:     Effort: Pulmonary effort is normal. No respiratory distress.     Breath sounds: Normal breath sounds. No wheezing, rhonchi or rales.  Chest:     Chest wall: Tenderness (mild anterior chest wall) present.  Abdominal:     General:  There is no distension.     Palpations: Abdomen is soft.     Tenderness: There is no abdominal tenderness.  Musculoskeletal:     Cervical back: Neck supple. No rigidity.     Right lower leg: No edema.     Left lower leg: No edema.  Lymphadenopathy:     Cervical: No cervical adenopathy.  Skin:    General: Skin is warm and dry.     Findings: No rash.  Neurological:     Mental Status: He is alert.  Psychiatric:        Behavior: Behavior normal.     ED Results / Procedures / Treatments   Labs (all labs ordered are listed, but only abnormal results are displayed) Labs Reviewed - No data to display  EKG None  Radiology No results found.  Procedures Procedures (including critical care time)  Medications Ordered in ED Medications - No data to display  ED Course  I have reviewed the triage vital signs and the nursing notes.  Pertinent labs & imaging results that were available during my care of the patient were reviewed by me and considered in my medical decision making (see chart for details).    Shawn Barajas was evaluated in Emergency Department on 08/08/2020 for the symptoms described in the history of present illness. He/she was evaluated in the context of the global COVID-19 pandemic, which necessitated consideration that the patient might be at risk for infection with the SARS-CoV-2 virus that causes COVID-19. Institutional protocols and algorithms that pertain to the evaluation of patients at risk for COVID-19 are in a state of rapid change based on information released by regulatory bodies including the CDC and federal and state organizations. These policies and algorithms were followed during the patient's care in the ED.  MDM Rules/Calculators/A&P                          Patient with known positive COVID 19 test presents to the ED with multiple sxs and generally not feeling well x 6 days.  He is nontoxic, resting comfortably, his vitals are within normal limits with  the exception of elevated BP initially which has normalized.  He has no signs of AOM, AOE, or mastoiditis.  Per Centor score do not suspect strep throat.  Exam not consistent with RPA/PTA.  No meningismus.  Lungs are clear to auscultation bilaterally.  No significant wheezing.  No significant increased work of breathing.  Patient is ambulatory with SPO2 of 99 to 100% on room air without signs of respiratory distress.  His chest discomfort is reproducible with chest wall palpation, he is not hypoxic or tachycardic does not have additional risk factors to raise concern for pulmonary embolism, PERC negative.  Abdomen is nontender without peritoneal signs.  He overall appears appropriate for  discharge home at this time.  Will provide supportive care. I discussed treatment plan, need for follow-up, and return precautions with the patient. Provided opportunity for questions, patient confirmed understanding and is in agreement with plan.   Final Clinical Impression(s) / ED Diagnoses Final diagnoses:  COVID-19    Rx / DC Orders ED Discharge Orders         Ordered    fluticasone (FLONASE) 50 MCG/ACT nasal spray  Daily        08/08/20 1634    benzonatate (TESSALON) 100 MG capsule  Every 8 hours        08/08/20 1634    ondansetron (ZOFRAN ODT) 4 MG disintegrating tablet  Every 8 hours PRN        08/08/20 1634    naproxen (NAPROSYN) 500 MG tablet  2 times daily PRN        08/08/20 1634           Jearldean Gutt, Pleas Koch, PA-C 08/08/20 1708    Rolan Bucco, MD 08/08/20 1747

## 2020-08-08 NOTE — ED Notes (Signed)
States he is covid positive, been having HA, cough as well. Chest ache, weakness and body aches. Home test Dec 14th and was positive. Did not rec Vaccines for covid

## 2020-08-18 ENCOUNTER — Telehealth: Payer: Self-pay | Admitting: Nurse Practitioner

## 2020-08-18 NOTE — Telephone Encounter (Signed)
[facility] called pt [phone] LVM for recovery status from ED visit & to schedule for establish care with a PCP.

## 2021-02-17 ENCOUNTER — Encounter (HOSPITAL_BASED_OUTPATIENT_CLINIC_OR_DEPARTMENT_OTHER): Payer: Self-pay

## 2021-02-17 ENCOUNTER — Emergency Department (HOSPITAL_BASED_OUTPATIENT_CLINIC_OR_DEPARTMENT_OTHER)
Admission: EM | Admit: 2021-02-17 | Discharge: 2021-02-17 | Disposition: A | Discharge status: Home / Self Care | Payer: BLUE CROSS/BLUE SHIELD | Attending: Emergency Medicine | Admitting: Emergency Medicine

## 2021-02-17 ENCOUNTER — Other Ambulatory Visit: Payer: Self-pay

## 2021-02-17 DIAGNOSIS — Y93B9 Activity, other involving muscle strengthening exercises: Secondary | ICD-10-CM | POA: Diagnosis not present

## 2021-02-17 DIAGNOSIS — S39012A Strain of muscle, fascia and tendon of lower back, initial encounter: Secondary | ICD-10-CM | POA: Diagnosis not present

## 2021-02-17 DIAGNOSIS — S3992XA Unspecified injury of lower back, initial encounter: Secondary | ICD-10-CM | POA: Diagnosis present

## 2021-02-17 DIAGNOSIS — X500XXA Overexertion from strenuous movement or load, initial encounter: Secondary | ICD-10-CM | POA: Insufficient documentation

## 2021-02-17 DIAGNOSIS — Z8616 Personal history of COVID-19: Secondary | ICD-10-CM | POA: Insufficient documentation

## 2021-02-17 DIAGNOSIS — Y9239 Other specified sports and athletic area as the place of occurrence of the external cause: Secondary | ICD-10-CM | POA: Insufficient documentation

## 2021-02-17 MED ORDER — IBUPROFEN 400 MG PO TABS
400.0000 mg | ORAL_TABLET | Freq: Once | ORAL | Status: AC
  Administered 2021-02-17: 04:00:00 400 mg via ORAL
  Filled 2021-02-17: qty 1

## 2021-02-17 MED ORDER — CYCLOBENZAPRINE HCL 10 MG PO TABS: 10.0000 mg | ORAL_TABLET | Freq: Three times a day (TID) | ORAL | 0 refills | Status: DC | PRN

## 2021-02-17 MED ORDER — CYCLOBENZAPRINE HCL 10 MG PO TABS
10.0000 mg | ORAL_TABLET | Freq: Once | ORAL | Status: AC
  Administered 2021-02-17: 04:00:00 10 mg via ORAL
  Filled 2021-02-17: qty 1

## 2021-02-17 MED ORDER — ACETAMINOPHEN 325 MG PO TABS
650.0000 mg | ORAL_TABLET | Freq: Once | ORAL | Status: AC
  Administered 2021-02-17: 04:00:00 650 mg via ORAL
  Filled 2021-02-17: qty 2

## 2021-02-17 NOTE — ED Provider Notes (Signed)
[facility] EMERGENCY DEPT Provider Note   CSN: [id number] Arrival date & time: 02/17/21  0315     History Chief Complaint  Patient presents with   Back Pain    Lower    Shawn Barajas is a 25 y.o. male.  The history is provided by the patient.  Back Pain He states that he had done a lot of squats with extra weight yesterday.  Today, he woke up with soreness in his back which has gotten progressively worse.  He tried taking ibuprofen without relief.  He went to work tonight, but was unable to continue working because of the pain.  Pain is worse with walking.  There is no radiation of pain.  Denies any bowel or bladder dysfunction.  He denies any weakness or numbness or tingling.  Pain is rated at 10/10.  Of note, he had not been doing any exercising for quite some time before going back yesterday.   Past Medical History:  Diagnosis Date   COVID     There are no problems to display for this patient.   History reviewed. No pertinent surgical history.     No family history on file.  Social History   Tobacco Use   Smoking status: Never   Smokeless tobacco: Never  Vaping Use   Vaping Use: Never used  Substance Use Topics   Alcohol use: Not Currently    Comment: social    Drug use: Never    Home Medications Prior to Admission medications   Medication Sig Start Date End Date Taking? Authorizing Provider  benzonatate (TESSALON) 100 MG capsule Take 1 capsule (100 mg total) by mouth every 8 (eight) hours. 08/08/20   Petrucelli, Samantha R, PA-C  fluticasone (FLONASE) 50 MCG/ACT nasal spray Place 1 spray into both nostrils daily. 08/08/20   Petrucelli, Samantha R, PA-C  naproxen (NAPROSYN) 500 MG tablet Take 1 tablet (500 mg total) by mouth 2 (two) times daily as needed for moderate pain. 08/08/20   Petrucelli, Samantha R, PA-C  ondansetron (ZOFRAN ODT) 4 MG disintegrating tablet Take 1 tablet (4 mg total) by mouth every 8 (eight) hours as needed for nausea or  vomiting. 08/08/20   Petrucelli, Pleas Koch, PA-C    Allergies    Patient has no known allergies.  Review of Systems   Review of Systems  Musculoskeletal:  Positive for back pain.  All other systems reviewed and are negative.  Physical Exam Updated Vital Signs BP 120/79 (BP Location: Right Arm)   Pulse 60   Temp 98.5 F (36.9 C) (Oral)   Resp 16   Ht 5\' 8"  (1.727 m)   Wt 68 kg   SpO2 100%   BMI 22.81 kg/m   Physical Exam Vitals and nursing note reviewed.  25 year old male, resting comfortably and in no acute distress. Vital signs are normal. Oxygen saturation is 100%, which is normal. Head is normocephalic and atraumatic. PERRLA, EOMI. Oropharynx is clear. Neck is nontender and supple without adenopathy or JVD. Back has tenderness throughout the lumbar and paralumbar area.  There is moderate bilateral paralumbar spasm.  Straight leg raise is positive at 30 degrees bilaterally.  There is no CVA tenderness. Lungs are clear without rales, wheezes, or rhonchi. Chest is nontender. Heart has regular rate and rhythm without murmur. Abdomen is soft, flat, nontender without masses or hepatosplenomegaly and peristalsis is normoactive. Extremities have no cyanosis or edema, full range of motion is present. Skin is warm and dry without rash. Neurologic:  Mental status is normal, cranial nerves are intact, there are no motor or sensory deficits.  ED Results / Procedures / Treatments    Procedures Procedures   Medications Ordered in ED Medications  acetaminophen (TYLENOL) tablet 650 mg (650 mg Oral Given 02/17/21 0340)  ibuprofen (ADVIL) tablet 400 mg (400 mg Oral Given 02/17/21 0340)  cyclobenzaprine (FLEXERIL) tablet 10 mg (10 mg Oral Given 02/17/21 0340)    ED Course  I have reviewed the triage vital signs and the nursing notes.  MDM Rules/Calculators/A&P                         Acute lumbar strain which appears mostly muscular.  No red flags to suggest more serious pathology.   No indication for imaging today.  He is discharged with instructions to apply ice, use over-the-counter NSAIDs plus acetaminophen as needed for pain.  Given prescription for cyclobenzaprine.  Old records are reviewed, and he has no relevant past visits.  He is given a work release for the next few days.  Final Clinical Impression(s) / ED Diagnoses Final diagnoses:  Acute myofascial strain of lumbar region, initial encounter    Rx / DC Orders ED Discharge Orders          Ordered    cyclobenzaprine (FLEXERIL) 10 MG tablet  3 times daily PRN        02/17/21 0344             Dione Booze, MD 02/17/21 [phone]

## 2021-02-17 NOTE — Discharge Instructions (Addendum)
Apply ice for 30 minutes at a time, 4 times a day.  Take ibuprofen or naproxen as needed for pain.  To get additional pain relief, add acetaminophen.  Combining acetaminophen with ibuprofen or naproxen gives you better pain relief than taking either medication by itself.

## 2021-02-17 NOTE — ED Triage Notes (Addendum)
Patient here POV from with Lower Back Pain.  No Recent Trauma or Injury but patient states he may have "pulled a muscle" when he was at the gym yesterday. Patient awoke with Back Pain this AM.  Minimal Relief at home with OTC Medcication.  Ambulatory, GCS 15.

## 2023-06-04 ENCOUNTER — Other Ambulatory Visit: Payer: Self-pay

## 2023-06-04 ENCOUNTER — Emergency Department (HOSPITAL_BASED_OUTPATIENT_CLINIC_OR_DEPARTMENT_OTHER)
Admission: EM | Admit: 2023-06-04 | Discharge: 2023-06-04 | Disposition: A | Discharge status: Home / Self Care | Payer: Medicaid Other | Attending: Emergency Medicine | Admitting: Emergency Medicine

## 2023-06-04 ENCOUNTER — Encounter (HOSPITAL_BASED_OUTPATIENT_CLINIC_OR_DEPARTMENT_OTHER): Payer: Self-pay | Admitting: Emergency Medicine

## 2023-06-04 ENCOUNTER — Other Ambulatory Visit (HOSPITAL_BASED_OUTPATIENT_CLINIC_OR_DEPARTMENT_OTHER): Payer: Self-pay

## 2023-06-04 DIAGNOSIS — R197 Diarrhea, unspecified: Secondary | ICD-10-CM | POA: Diagnosis not present

## 2023-06-04 DIAGNOSIS — R109 Unspecified abdominal pain: Secondary | ICD-10-CM | POA: Diagnosis present

## 2023-06-04 DIAGNOSIS — R1013 Epigastric pain: Secondary | ICD-10-CM | POA: Diagnosis not present

## 2023-06-04 LAB — COMPREHENSIVE METABOLIC PANEL
ALT: 15 U/L (ref 0–44)
AST: 14 U/L — ABNORMAL LOW (ref 15–41)
Albumin: 4.6 g/dL (ref 3.5–5.0)
Alkaline Phosphatase: 59 U/L (ref 38–126)
Anion gap: 7 (ref 5–15)
BUN: 13 mg/dL (ref 6–20)
CO2: 29 mmol/L (ref 22–32)
Calcium: 9.7 mg/dL (ref 8.9–10.3)
Chloride: 104 mmol/L (ref 98–111)
Creatinine, Ser: 0.95 mg/dL (ref 0.61–1.24)
GFR, Estimated: >60 mL/min (ref >60)
Glucose, Bld: 86 mg/dL (ref 70–99)
Potassium: 4.1 mmol/L (ref 3.5–5.1)
Sodium: 140 mmol/L (ref 135–145)
Total Bilirubin: 1.3 mg/dL — ABNORMAL HIGH (ref 0.3–1.2)
Total Protein: 7.3 g/dL (ref 6.5–8.1)

## 2023-06-04 LAB — CBC
HCT: 43.4 % (ref 39.0–52.0)
Hemoglobin: 14.1 g/dL (ref 13.0–17.0)
MCH: 27 pg (ref 26.0–34.0)
MCHC: 32.5 g/dL (ref 30.0–36.0)
MCV: 83.1 fL (ref 80.0–100.0)
Platelets: 201 10*3/uL (ref 150–400)
RBC: 5.22 MIL/uL (ref 4.22–5.81)
RDW: 13 % (ref 11.5–15.5)
WBC: 7.3 10*3/uL (ref 4.0–10.5)
nRBC: 0 % (ref 0.0–0.2)

## 2023-06-04 LAB — LIPASE, BLOOD: Lipase: 20 U/L (ref 11–51)

## 2023-06-04 MED ORDER — OMEPRAZOLE 20 MG PO CPDR
20.0000 mg | DELAYED_RELEASE_CAPSULE | Freq: Every day | ORAL | 0 refills | Status: DC
  Filled 2023-06-04: qty 14, 14d supply, fill #0

## 2023-06-04 NOTE — Discharge Instructions (Signed)
The new medicine to see if it will help.  Follow-up with gastroenterology for your somewhat chronic abdominal pain.

## 2023-06-04 NOTE — ED Triage Notes (Signed)
Pt via pov from home with abdominal pain that began in the night; also had diarrhea for 1 hour. Pt states he is having loud bowel sounds and gas. He reports that the pain is generalized. Pt reports that he has had similar episodes over the last few years, but this has lasted longer. Pt has not seen a doctor for these symptoms. Pt alert & oriented, nad noted.

## 2023-06-04 NOTE — ED Provider Notes (Signed)
  [place] EMERGENCY DEPARTMENT AT [facility] Provider Note   CSN: [id number] Arrival date & time: 06/04/23  1413     History  Chief Complaint  Patient presents with   Abdominal Pain    Shawn Barajas is a 27 y.o. male.   Abdominal Pain Patient presents abdominal pain.  States began last night but comes and goes.  Has had episodes like this for years.  Often associated with diarrhea.  Sometimes worse after eating.  Worse with alcohol.  States he had diarrhea a lot today.  No weight loss.    Home Medications Prior to Admission medications   Medication Sig Start Date End Date Taking? Authorizing Provider  omeprazole (PRILOSEC) 20 MG capsule Take 1 capsule (20 mg total) by mouth daily. 06/04/23  Yes Benjiman Core, MD  cyclobenzaprine (FLEXERIL) 10 MG tablet Take 1 tablet (10 mg total) by mouth 3 (three) times daily as needed for muscle spasms. 02/17/21   Dione Booze, MD      Allergies    Patient has no known allergies.    Review of Systems   Review of Systems  Gastrointestinal:  Positive for abdominal pain.    Physical Exam Updated Vital Signs BP 138/87   Pulse (!) 51   Temp 98.2 F (36.8 C)   Resp 18   Ht 5\' 9"  (1.753 m)   Wt 70.3 kg   SpO2 100%   BMI 22.89 kg/m  Physical Exam Vitals and nursing note reviewed.  Cardiovascular:     Rate and Rhythm: Normal rate.  Abdominal:     Tenderness: There is no abdominal tenderness.  Neurological:     Mental Status: He is alert.     ED Results / Procedures / Treatments   Labs (all labs ordered are listed, but only abnormal results are displayed) Labs Reviewed  COMPREHENSIVE METABOLIC PANEL - Abnormal; Notable for the following components:      Result Value   AST 14 (*)    Total Bilirubin 1.3 (*)    All other components within normal limits  LIPASE, BLOOD  CBC    EKG None  Radiology No results found.  Procedures Procedures    Medications Ordered in ED Medications - No data to  display  ED Course/ Medical Decision Making/ A&P                                 Medical Decision Making Amount and/or Complexity of Data Reviewed Labs: ordered.  Risk Prescription drug management.   Patient with abdominal pain.  Has had for years.  Worse today.  Sometimes associate with diarrhea.  Often upper abdomen.  Crampy and states he is extra gassy.  Differential diagnoses long but includes causes such as biliary disease gastritis, irritable bowel.  Benign abdominal exam and benign blood work.  Will start on Prilosec since pain radiates to his chest.  Will have follow-up with GI.  Appears stable for discharge home however.        Final Clinical Impression(s) / ED Diagnoses Final diagnoses:  Epigastric pain    Rx / DC Orders ED Discharge Orders          Ordered    omeprazole (PRILOSEC) 20 MG capsule  Daily        06/04/23 1543              Benjiman Core, MD 06/04/23 1549

## 2023-06-06 ENCOUNTER — Telehealth: Payer: Self-pay

## 2023-06-06 ENCOUNTER — Encounter: Payer: Self-pay | Admitting: Gastroenterology

## 2023-06-06 NOTE — Telephone Encounter (Signed)
Received inbound call from patient who reports he was unable to obtain an appointment for gastroenterologist referral to [facility], due that provider does not take his insurance. Per patient he was given  [facility] at [address]. Per chart review patient does not have a PCP to follow.   This RNCM spoke with [facility] [phone] who reports next appointment is not until Jan 2025. This RNCM will fax over clinical documents to initiate referral as patient was discharged from the [facility] ED on 06/04/23.    TOC following

## 2023-06-06 NOTE — Telephone Encounter (Signed)
This RNCM faxed referral request to [facility] fax # [phone] and received successful tx notice. This RNCM left voicemail with patient to advise [place] should follow up. Per [place] they do not have any available appointments until Jane 2025, patient will need to obtain a PCP to follow as well.   No additional TOC needs

## 2023-06-06 NOTE — Telephone Encounter (Signed)
Received inbound call from patient who reports [facility] indicated they did not receive fax. This RNCM advised information was faxed today at 12:53, received successful tx notice file #[id number] at 12:55 pm today. This RNCM encouraged patient to contact his insurance to obtain assigned PCP to establish care. This RNCM will fax over request again.  No additional TOC needs

## 2023-06-15 ENCOUNTER — Ambulatory Visit (HOSPITAL_BASED_OUTPATIENT_CLINIC_OR_DEPARTMENT_OTHER): Payer: Medicaid Other | Admitting: Family Medicine

## 2023-06-15 ENCOUNTER — Encounter (HOSPITAL_BASED_OUTPATIENT_CLINIC_OR_DEPARTMENT_OTHER): Payer: Self-pay | Admitting: Family Medicine

## 2023-06-15 ENCOUNTER — Other Ambulatory Visit (HOSPITAL_BASED_OUTPATIENT_CLINIC_OR_DEPARTMENT_OTHER): Payer: Self-pay

## 2023-06-15 VITALS — BP 141/81 | HR 69 | Ht 69.0 in | Wt 154.5 lb

## 2023-06-15 DIAGNOSIS — Z113 Encounter for screening for infections with a predominantly sexual mode of transmission: Secondary | ICD-10-CM

## 2023-06-15 DIAGNOSIS — K2100 Gastro-esophageal reflux disease with esophagitis, without bleeding: Secondary | ICD-10-CM

## 2023-06-15 DIAGNOSIS — K580 Irritable bowel syndrome with diarrhea: Secondary | ICD-10-CM | POA: Diagnosis not present

## 2023-06-15 DIAGNOSIS — Z7689 Persons encountering health services in other specified circumstances: Secondary | ICD-10-CM

## 2023-06-15 MED ORDER — DICYCLOMINE HCL 10 MG PO CAPS
10.0000 mg | ORAL_CAPSULE | Freq: Three times a day (TID) | ORAL | 2 refills | Status: DC
Start: 2023-06-15 — End: 2023-07-19
  Filled 2023-06-15: qty 30, 8d supply, fill #0
  Filled 2023-06-25: qty 30, 8d supply, fill #1
  Filled 2023-07-08: qty 30, 8d supply, fill #2

## 2023-06-15 MED ORDER — OMEPRAZOLE 20 MG PO CPDR
20.0000 mg | DELAYED_RELEASE_CAPSULE | Freq: Every day | ORAL | 2 refills | Status: DC
Start: 2023-06-15 — End: 2023-09-06
  Filled 2023-06-15: qty 30, 30d supply, fill #0
  Filled 2023-07-19: qty 30, 30d supply, fill #1
  Filled 2023-08-18 (×2): qty 30, 30d supply, fill #2

## 2023-06-15 NOTE — Patient Instructions (Signed)

## 2023-06-15 NOTE — Progress Notes (Unsigned)
   New Patient Office Visit  Subjective    Patient ID: Shawn Barajas, male    DOB: 1995/12/23  Age: 27 y.o. MRN: [id number]  CC:  Chief Complaint  Patient presents with   Gastroesophageal Reflux    Anything he eats gives bad indigestion, very gassy, feels bloated   STD Testing   HPI Shawn Barajas is a 27 year old male who presents to establish with Primary Care & Sports Medicine at [facility].  Former PCP: pediatrician, has only been to [facility]  Last physical: unknown   Abd pain- has been dealing with this for years Sometimes diarrhea, sometimes crampy feeling  A lot of gas & heartburn, does not matter what he eats  Burping a lot   Outpatient Encounter Medications as of 06/15/2023  Medication Sig   dicyclomine (BENTYL) 10 MG capsule Take 1 capsule (10 mg total) by mouth 4 (four) times daily -  before meals and at bedtime.   [DISCONTINUED] omeprazole (PRILOSEC) 20 MG capsule Take 1 capsule (20 mg total) by mouth daily.   omeprazole (PRILOSEC) 20 MG capsule Take 1 capsule (20 mg total) by mouth daily.   [DISCONTINUED] cyclobenzaprine (FLEXERIL) 10 MG tablet Take 1 tablet (10 mg total) by mouth 3 (three) times daily as needed for muscle spasms. (Patient not taking: Reported on 06/15/2023)   No facility-administered encounter medications on file as of 06/15/2023.    Past Medical History:  Diagnosis Date   COVID     Past Surgical History:  Procedure Laterality Date   INGUINAL HERNIA REPAIR      History reviewed. No pertinent family history.   Review of Systems  Constitutional:  Negative for chills, fever and weight loss.  Eyes:  Negative for blurred vision and double vision.  Respiratory:  Negative for cough and shortness of breath.   Cardiovascular:  Negative for chest pain, palpitations and leg swelling.  Gastrointestinal:  Positive for abdominal pain, diarrhea and heartburn. Negative for constipation, nausea and vomiting.  Genitourinary:  Negative for  frequency and urgency.    Objective    BP (!) 141/81   Pulse 69   Ht 5\' 9"  (1.753 m)   Wt 154 lb 8 oz (70.1 kg)   SpO2 100%   BMI 22.82 kg/m   Physical Exam      Assessment & Plan:   ***  Return in about 8 weeks (around 08/10/2023) for Physical with fasting labs.   Alyson Reedy, FNP

## 2023-06-16 ENCOUNTER — Encounter (HOSPITAL_BASED_OUTPATIENT_CLINIC_OR_DEPARTMENT_OTHER): Payer: Self-pay | Admitting: Family Medicine

## 2023-06-17 DIAGNOSIS — K580 Irritable bowel syndrome with diarrhea: Secondary | ICD-10-CM | POA: Insufficient documentation

## 2023-06-17 DIAGNOSIS — K2100 Gastro-esophageal reflux disease with esophagitis, without bleeding: Secondary | ICD-10-CM | POA: Insufficient documentation

## 2023-06-18 LAB — CT, NG, MYCOPLASMAS NAA, URINE
Mycoplasma genitalium NAA: NEGATIVE
Mycoplasma hominis NAA: NEGATIVE
Ureaplasma spp NAA: NEGATIVE

## 2023-06-18 LAB — RPR+HIV+GC+CT PANEL
Chlamydia trachomatis, NAA: NEGATIVE
HIV Screen 4th Generation wRfx: NONREACTIVE
Neisseria Gonorrhoeae by PCR: NEGATIVE
RPR Ser Ql: NONREACTIVE

## 2023-06-18 LAB — HCV AB W REFLEX TO QUANT PCR: HCV Ab: NONREACTIVE

## 2023-06-18 LAB — HCV INTERPRETATION

## 2023-07-12 ENCOUNTER — Ambulatory Visit (HOSPITAL_BASED_OUTPATIENT_CLINIC_OR_DEPARTMENT_OTHER): Payer: Medicaid Other

## 2023-07-12 ENCOUNTER — Encounter (HOSPITAL_BASED_OUTPATIENT_CLINIC_OR_DEPARTMENT_OTHER): Payer: Self-pay | Admitting: Family Medicine

## 2023-07-12 ENCOUNTER — Encounter (HOSPITAL_BASED_OUTPATIENT_CLINIC_OR_DEPARTMENT_OTHER): Payer: Self-pay | Admitting: Student

## 2023-07-12 ENCOUNTER — Ambulatory Visit (HOSPITAL_BASED_OUTPATIENT_CLINIC_OR_DEPARTMENT_OTHER): Payer: Medicaid Other | Admitting: Student

## 2023-07-12 DIAGNOSIS — S63639A Sprain of interphalangeal joint of unspecified finger, initial encounter: Secondary | ICD-10-CM

## 2023-07-12 DIAGNOSIS — M79644 Pain in right finger(s): Secondary | ICD-10-CM

## 2023-07-12 NOTE — Progress Notes (Signed)
Chief Complaint: Finger pain     History of Present Illness:    Shawn Barajas is a 27 y.o. left-hand-dominant male here today for evaluation of pain in his right finger.  He states that this finger has been painful for the last 4 to 5 months but denies any causative injury.  Pain is located over the PIP joint, is moderate in severity, and is significantly worsened with flexion of the finger.  He has minimal discomfort with the finger in a neutral or extended position.  Denies any treatments for it up to this point.  No numbness or tingling.   Surgical History:   None  PMH/PSH/Family History/Social History/Meds/Allergies:    Past Medical History:  Diagnosis Date   COVID    Past Surgical History:  Procedure Laterality Date   INGUINAL HERNIA REPAIR     Social History   Socioeconomic History   Marital status: Single    Spouse name: Not on file   Number of children: Not on file   Years of education: Not on file   Highest education level: Not on file  Occupational History   Not on file  Tobacco Use   Smoking status: Never   Smokeless tobacco: Never  Vaping Use   Vaping status: Never Used  Substance and Sexual Activity   Alcohol use: Not Currently    Comment: social    Drug use: Yes    Types: Marijuana    Comment: 1-2 per week   Sexual activity: Yes    Birth control/protection: None  Other Topics Concern   Not on file  Social History Narrative   Not on file   Social Determinants of Health   Financial Resource Strain: Not on file  Food Insecurity: Not on file  Transportation Needs: Not on file  Physical Activity: Not on file  Stress: Not on file  Social Connections: Not on file   History reviewed. No pertinent family history. No Known Allergies Current Outpatient Medications  Medication Sig Dispense Refill   dicyclomine (BENTYL) 10 MG capsule Take 1 capsule (10 mg total) by mouth 4 (four) times daily -  before meals and at  bedtime. 30 capsule 2   omeprazole (PRILOSEC) 20 MG capsule Take 1 capsule (20 mg total) by mouth daily. 30 capsule 2   No current facility-administered medications for this visit.   No results found.  Review of Systems:   A ROS was performed including pertinent positives and negatives as documented in the HPI.  Physical Exam :   Constitutional: NAD and appears stated age Neurological: Alert and oriented Psych: Appropriate affect and cooperative There were no vitals taken for this visit.   Comprehensive Musculoskeletal Exam:    Mild swelling noted of the right fourth finger at the PIP joint.  No significant tenderness to palpation of the finger.  He is able to perform full flexion and extension of the finger at all joints and can form a fist with good strength.  No pain or laxity with varus and valgus stress.  Imaging:   Xray (right ring finger 3 views): Small volar plate avulsion of the proximal middle phalanx.   I personally reviewed and interpreted the radiographs.   Assessment:   27 y.o. male with approximately 37-month history of pain at the right ring finger PIP.  He  denies ever having any injury to this finger.  X-rays today do show evidence of a volar plate avulsion right where he continues to have pain.  He is managing this fairly well as he is able to workout and perform all of his daily activities however the consistent pain has become bothersome.  Given the unknown acuity of this injury I would like to have him seen by our hand specialist Dr. Denese Killings to determine further treatment.  Plan :    -Referral to Dr. Denese Killings for further evaluation and management     I personally saw and evaluated the patient, and participated in the management and treatment plan.  Hazle Nordmann, PA-C Orthopedics

## 2023-07-19 ENCOUNTER — Other Ambulatory Visit (HOSPITAL_BASED_OUTPATIENT_CLINIC_OR_DEPARTMENT_OTHER): Payer: Self-pay | Admitting: Family Medicine

## 2023-07-19 DIAGNOSIS — K580 Irritable bowel syndrome with diarrhea: Secondary | ICD-10-CM

## 2023-07-21 ENCOUNTER — Encounter (HOSPITAL_BASED_OUTPATIENT_CLINIC_OR_DEPARTMENT_OTHER): Payer: Self-pay

## 2023-07-23 ENCOUNTER — Other Ambulatory Visit (HOSPITAL_COMMUNITY): Payer: Self-pay

## 2023-07-23 ENCOUNTER — Other Ambulatory Visit (HOSPITAL_BASED_OUTPATIENT_CLINIC_OR_DEPARTMENT_OTHER): Payer: Self-pay

## 2023-07-23 MED ORDER — DICYCLOMINE HCL 10 MG PO CAPS
10.0000 mg | ORAL_CAPSULE | Freq: Three times a day (TID) | ORAL | 2 refills | Status: DC
  Filled 2023-07-23 (×2): qty 30, 8d supply, fill #0
  Filled 2023-08-02: qty 30, 8d supply, fill #1

## 2023-07-25 ENCOUNTER — Other Ambulatory Visit (HOSPITAL_BASED_OUTPATIENT_CLINIC_OR_DEPARTMENT_OTHER): Payer: Self-pay

## 2023-08-02 ENCOUNTER — Other Ambulatory Visit: Payer: Self-pay

## 2023-08-03 ENCOUNTER — Other Ambulatory Visit (HOSPITAL_BASED_OUTPATIENT_CLINIC_OR_DEPARTMENT_OTHER): Payer: Self-pay

## 2023-08-03 ENCOUNTER — Other Ambulatory Visit (HOSPITAL_BASED_OUTPATIENT_CLINIC_OR_DEPARTMENT_OTHER): Payer: Medicaid Other

## 2023-08-03 ENCOUNTER — Other Ambulatory Visit (HOSPITAL_BASED_OUTPATIENT_CLINIC_OR_DEPARTMENT_OTHER): Payer: Self-pay | Admitting: Family Medicine

## 2023-08-03 DIAGNOSIS — K580 Irritable bowel syndrome with diarrhea: Secondary | ICD-10-CM

## 2023-08-03 DIAGNOSIS — Z0000 Encounter for general adult medical examination without abnormal findings: Secondary | ICD-10-CM

## 2023-08-03 MED ORDER — DICYCLOMINE HCL 10 MG PO CAPS
10.0000 mg | ORAL_CAPSULE | Freq: Three times a day (TID) | ORAL | 1 refills | Status: DC
  Filled 2023-08-03 (×2): qty 120, 30d supply, fill #0
  Filled 2023-09-12: qty 120, 30d supply, fill #1

## 2023-08-04 LAB — COMPREHENSIVE METABOLIC PANEL
ALT: 24 [IU]/L (ref 0–44)
AST: 27 [IU]/L (ref 0–40)
Albumin: 4.3 g/dL (ref 4.3–5.2)
Alkaline Phosphatase: 81 [IU]/L (ref 44–121)
BUN/Creatinine Ratio: 11 (ref 9–20)
BUN: 11 mg/dL (ref 6–20)
Bilirubin Total: 0.5 mg/dL (ref 0.0–1.2)
CO2: 23 mmol/L (ref 20–29)
Calcium: 9.1 mg/dL (ref 8.7–10.2)
Chloride: 104 mmol/L (ref 96–106)
Creatinine, Ser: 1.03 mg/dL (ref 0.76–1.27)
Globulin, Total: 2.3 g/dL (ref 1.5–4.5)
Glucose: 133 mg/dL — ABNORMAL HIGH (ref 70–99)
Potassium: 4.4 mmol/L (ref 3.5–5.2)
Sodium: 140 mmol/L (ref 134–144)
Total Protein: 6.6 g/dL (ref 6.0–8.5)
eGFR: 101 mL/min/{1.73_m2} (ref >59)

## 2023-08-04 LAB — HEMOGLOBIN A1C
Est. average glucose Bld gHb Est-mCnc: 123 mg/dL
Hgb A1c MFr Bld: 5.9 % — ABNORMAL HIGH (ref 4.8–5.6)

## 2023-08-04 LAB — CBC WITH DIFFERENTIAL/PLATELET
Basophils Absolute: 0.1 10*3/uL (ref 0.0–0.2)
Basos: 1 %
EOS (ABSOLUTE): 0.1 10*3/uL (ref 0.0–0.4)
Eos: 1 %
Hematocrit: 42.2 % (ref 37.5–51.0)
Hemoglobin: 13.4 g/dL (ref 13.0–17.7)
Immature Grans (Abs): 0 10*3/uL (ref 0.0–0.1)
Immature Granulocytes: 0 %
Lymphocytes Absolute: 2.4 10*3/uL (ref 0.7–3.1)
Lymphs: 43 %
MCH: 27.3 pg (ref 26.6–33.0)
MCHC: 31.8 g/dL (ref 31.5–35.7)
MCV: 86 fL (ref 79–97)
Monocytes Absolute: 0.5 10*3/uL (ref 0.1–0.9)
Monocytes: 8 %
Neutrophils Absolute: 2.6 10*3/uL (ref 1.4–7.0)
Neutrophils: 47 %
Platelets: 191 10*3/uL (ref 150–450)
RBC: 4.9 x10E6/uL (ref 4.14–5.80)
RDW: 12.8 % (ref 11.6–15.4)
WBC: 5.6 10*3/uL (ref 3.4–10.8)

## 2023-08-04 LAB — LIPID PANEL
Chol/HDL Ratio: 3.5 {ratio} (ref 0.0–5.0)
Cholesterol, Total: 162 mg/dL (ref 100–199)
HDL: 46 mg/dL (ref >39)
LDL Chol Calc (NIH): 104 mg/dL — ABNORMAL HIGH (ref 0–99)
Triglycerides: 62 mg/dL (ref 0–149)
VLDL Cholesterol Cal: 12 mg/dL (ref 5–40)

## 2023-08-04 LAB — TSH RFX ON ABNORMAL TO FREE T4: TSH: 0.611 u[IU]/mL (ref 0.450–4.500)

## 2023-08-10 ENCOUNTER — Encounter (HOSPITAL_BASED_OUTPATIENT_CLINIC_OR_DEPARTMENT_OTHER): Payer: Medicaid Other | Admitting: Family Medicine

## 2023-08-13 ENCOUNTER — Ambulatory Visit: Payer: Medicaid Other | Admitting: Orthopedic Surgery

## 2023-08-13 ENCOUNTER — Other Ambulatory Visit (INDEPENDENT_AMBULATORY_CARE_PROVIDER_SITE_OTHER): Payer: Self-pay

## 2023-08-13 DIAGNOSIS — S63639A Sprain of interphalangeal joint of unspecified finger, initial encounter: Secondary | ICD-10-CM

## 2023-08-13 DIAGNOSIS — S63614A Unspecified sprain of right ring finger, initial encounter: Secondary | ICD-10-CM | POA: Diagnosis not present

## 2023-08-13 NOTE — Progress Notes (Signed)
Shawn Barajas - 27 y.o. male MRN [id number]  Date of birth: October 19, 1995  Office Visit Note: Visit Date: 08/13/2023 PCP: Alyson Reedy, FNP Referred by: Amador Cunas, PA*  Subjective: No chief complaint on file.  HPI: Shawn Barajas is a pleasant 27 y.o. male who presents today for evaluation of a right ring finger PIP injury sustained multiple months prior.  He was seen by Hazle Nordmann, PA in November of this year, there was concern for a possible volar plate injury at that time.  He states that his swelling in motion have improved significantly over the past month.  Pertinent ROS were reviewed with the patient and found to be negative unless otherwise specified above in HPI.   Visit Reason: right ring finger PIP pain and stiffness Duration of symptoms: months Hand dominance: right Occupation:electrician Diabetic: No Smoking: Yes/ marijuana  Heart/Lung History: none Blood Thinners: none  Prior Testing/EMG: 07/20/23 xrays Injections (Date): none Treatments: none Prior Surgery:none  Assessment & Plan: Visit Diagnoses:  1. Volar plate injury of finger, initial encounter     Plan: Extensive discussion was had the patient today regarding his right ring finger PIP injury.  He appears to have suffered a PIP sprain, there may have been a small bony avulsion at the previous x-ray, on today's repeat films I do not see any significant bony avulsion that is persisting.  His range of motion is doing quite well at the PIP joint with no inherent instability, at this juncture I do not see any need for formalized therapy.  I did emphasize for him that the PIP sprain can take off in 1 year for total resolution and healing.  He expressed full understanding.  Activities as tolerated moving forward, follow-up as needed.  Follow-up: No follow-ups on file.   Meds & Orders: No orders of the defined types were placed in this encounter.   Orders Placed This Encounter  Procedures   XR Finger  Ring Right     Procedures: No procedures performed      Clinical History: No specialty comments available.  He reports that he has never smoked. He has never used smokeless tobacco.  Recent Labs    08/03/23 1018  HGBA1C 5.9*    Objective:   Vital Signs: There were no vitals taken for this visit.  Physical Exam  Gen: Well-appearing, in no acute distress; non-toxic CV: Regular Rate. Well-perfused. Warm.  Resp: Breathing unlabored on room air; no wheezing. Psych: Fluid speech in conversation; appropriate affect; normal thought process  Ortho Exam Right hand: - Mild tenderness to deep palpation at the PIP volarly of the ring finger - Ring finger without instability with stress testing at the PIP - Composite fist without restriction, all digits to the corresponding distal palmar crease  Imaging: XR Finger Ring Right Result Date: 08/13/2023 X-rays of the right ring finger, multiple views were conducted today X-rays demonstrate stable appearance of the PIP joint, well located in all planes without significant bony abnormalities appreciated.   Past Medical/Family/Surgical/Social History: Medications & Allergies reviewed per EMR, new medications updated. Patient Active Problem List   Diagnosis Date Noted   Gastroesophageal reflux disease with esophagitis 06/17/2023   Irritable bowel syndrome with diarrhea 06/17/2023   Past Medical History:  Diagnosis Date   COVID    No family history on file. Past Surgical History:  Procedure Laterality Date   INGUINAL HERNIA REPAIR     Social History   Occupational History   Not on file  Tobacco  Use   Smoking status: Never   Smokeless tobacco: Never  Vaping Use   Vaping status: Never Used  Substance and Sexual Activity   Alcohol use: Not Currently    Comment: social    Drug use: Yes    Types: Marijuana    Comment: 1-2 per week   Sexual activity: Yes    Birth control/protection: None    Castella Lerner Fara Boros) Denese Killings, M.D. [facility] 2:29 PM

## 2023-08-18 ENCOUNTER — Other Ambulatory Visit (HOSPITAL_BASED_OUTPATIENT_CLINIC_OR_DEPARTMENT_OTHER): Payer: Self-pay

## 2023-08-20 ENCOUNTER — Other Ambulatory Visit (HOSPITAL_BASED_OUTPATIENT_CLINIC_OR_DEPARTMENT_OTHER): Payer: Self-pay

## 2023-08-20 ENCOUNTER — Ambulatory Visit (INDEPENDENT_AMBULATORY_CARE_PROVIDER_SITE_OTHER): Payer: Medicaid Other | Admitting: Family Medicine

## 2023-08-20 ENCOUNTER — Encounter (HOSPITAL_BASED_OUTPATIENT_CLINIC_OR_DEPARTMENT_OTHER): Payer: Self-pay | Admitting: Family Medicine

## 2023-08-20 VITALS — BP 121/85 | HR 80 | Ht 69.0 in | Wt 159.0 lb

## 2023-08-20 DIAGNOSIS — Z113 Encounter for screening for infections with a predominantly sexual mode of transmission: Secondary | ICD-10-CM | POA: Diagnosis not present

## 2023-08-20 DIAGNOSIS — Z0000 Encounter for general adult medical examination without abnormal findings: Secondary | ICD-10-CM

## 2023-08-20 MED ORDER — OMEPRAZOLE 20 MG PO CPDR
20.0000 mg | DELAYED_RELEASE_CAPSULE | Freq: Every day | ORAL | 1 refills | Status: DC
Start: 2023-08-20 — End: 2024-01-15
  Filled 2023-08-20: qty 90, 90d supply, fill #0
  Filled 2023-11-25 – 2023-11-28 (×2): qty 90, 90d supply, fill #1

## 2023-08-20 NOTE — Progress Notes (Signed)
Complete physical exam  Patient: Shawn Barajas   DOB: [birth year]   27 y.o. Male  MRN: [id number]  Subjective:    Arber Dooling is a 27 y.o. male who presents today for a complete physical exam. He reports consuming a general diet. Gym/ health club routine includes cardio and mod to heavy weightlifting. He generally feels well. He reports sleeping well. He does not have additional problems to discuss today.   Vision: UTD Dentist: UTD   Depression screenings:    06/15/2023    9:40 AM  Depression screen PHQ 2/9  Decreased Interest 0  Down, Depressed, Hopeless 0  PHQ - 2 Score 0    Anxiety screenings:     No data to display          Patient Care Team: Alyson Reedy, FNP as PCP - General (Family Medicine)   Outpatient Medications Prior to Visit  Medication Sig   dicyclomine (BENTYL) 10 MG capsule Take 1 capsule (10 mg total) by mouth 4 (four) times daily -  before meals and at bedtime.   dicyclomine (BENTYL) 10 MG capsule Take 1 capsule (10 mg total) by mouth 4 (four) times daily -  before meals and at bedtime.   omeprazole (PRILOSEC) 20 MG capsule Take 1 capsule (20 mg total) by mouth daily.   No facility-administered medications prior to visit.    Review of Systems  Constitutional:  Negative for chills, fever, malaise/fatigue and weight loss.  Eyes:  Negative for blurred vision and double vision.  Respiratory:  Negative for cough.   Cardiovascular:  Negative for chest pain, palpitations and leg swelling.  Gastrointestinal:  Negative for abdominal pain, nausea and vomiting.  Genitourinary:  Negative for frequency and urgency.  Neurological:  Negative for dizziness, weakness and headaches.  Endo/Heme/Allergies:  Negative for polydipsia.  Psychiatric/Behavioral:  Negative for depression and suicidal ideas. The patient is not nervous/anxious and does not have insomnia.   All other systems reviewed and are negative.     Objective:     BP 121/85   Pulse 80   Ht 5'  9" (1.753 m)   Wt 159 lb (72.1 kg)   SpO2 98%   BMI 23.48 kg/m  BP Readings from Last 3 Encounters:  08/20/23 121/85  06/15/23 (!) 141/81  06/04/23 138/87   Physical Exam Vitals reviewed.  Constitutional:      Appearance: Normal appearance.  HENT:     Head: Normocephalic.     Right Ear: Tympanic membrane, ear canal and external ear normal.     Left Ear: Tympanic membrane, ear canal and external ear normal.     Nose: Nose normal.     Mouth/Throat:     Mouth: Mucous membranes are moist.     Pharynx: Oropharynx is clear.  Eyes:     Extraocular Movements: Extraocular movements intact.     Pupils: Pupils are equal, round, and reactive to light.  Cardiovascular:     Rate and Rhythm: Normal rate and regular rhythm.     Pulses: Normal pulses.     Heart sounds: Normal heart sounds.  Pulmonary:     Effort: Pulmonary effort is normal.     Breath sounds: Normal breath sounds.  Abdominal:     General: Abdomen is flat. Bowel sounds are normal.     Palpations: Abdomen is soft.  Musculoskeletal:        General: Normal range of motion.     Cervical back: Normal range of motion.  Skin:  General: Skin is warm and dry.  Neurological:     Mental Status: He is alert.  Psychiatric:        Mood and Affect: Mood normal.        Behavior: Behavior normal.        Thought Content: Thought content normal.        Judgment: Judgment normal.         Assessment & Plan:    Routine Health Maintenance and Physical Exam  Health Maintenance  Topic Date Due   COVID-19 Vaccine (1 - 2024-25 season) Never done   Flu Shot  11/19/2023*   DTaP/Tdap/Td vaccine (9 - Td or Tdap) 01/03/2026   Hepatitis C Screening  Completed   HIV Screening  Completed   HPV Vaccine  Aged Out  *Topic was postponed. The date shown is not the original due date.    1. Wellness examination (Primary) Routine labs reviewed and discussed today.  Review of PMH, FH, SH, medications and HM performed. Preventative care  hand-out provided.  Recommend healthy diet.  Recommend approximately 150 minutes/week of moderate intensity exercise. Recommend regular dental and vision exams. Always use seatbelt/lap and shoulder restraints. Recommend using smoke alarms and checking batteries at least twice a year. Recommend using sunscreen when outside. Discussed immunization recommendations. Vaccines are UTD.    2. Routine screening for STI (sexually transmitted infection) Patient would like routine STI testing- denies symptoms. Will update patient with results.  - Ct, Ng, Mycoplasmas NAA, Urine   Return in about 1 year (around 08/19/2024) for Physical with fasting labs.     Alyson Reedy, FNP

## 2023-08-20 NOTE — Patient Instructions (Signed)

## 2023-08-22 LAB — CT, NG, MYCOPLASMAS NAA, URINE
Chlamydia trachomatis, NAA: NEGATIVE
Mycoplasma genitalium NAA: NEGATIVE
Mycoplasma hominis NAA: NEGATIVE
Neisseria gonorrhoeae, NAA: NEGATIVE
Ureaplasma spp NAA: NEGATIVE

## 2023-08-23 ENCOUNTER — Encounter (HOSPITAL_BASED_OUTPATIENT_CLINIC_OR_DEPARTMENT_OTHER): Payer: Self-pay | Admitting: Family Medicine

## 2023-09-06 ENCOUNTER — Other Ambulatory Visit (INDEPENDENT_AMBULATORY_CARE_PROVIDER_SITE_OTHER): Payer: Medicaid Other

## 2023-09-06 ENCOUNTER — Ambulatory Visit (INDEPENDENT_AMBULATORY_CARE_PROVIDER_SITE_OTHER): Payer: Medicaid Other | Admitting: Gastroenterology

## 2023-09-06 ENCOUNTER — Encounter: Payer: Self-pay | Admitting: Gastroenterology

## 2023-09-06 VITALS — BP 120/80 | HR 87 | Ht 69.0 in | Wt 159.2 lb

## 2023-09-06 DIAGNOSIS — K219 Gastro-esophageal reflux disease without esophagitis: Secondary | ICD-10-CM | POA: Diagnosis not present

## 2023-09-06 DIAGNOSIS — K589 Irritable bowel syndrome without diarrhea: Secondary | ICD-10-CM | POA: Diagnosis not present

## 2023-09-06 DIAGNOSIS — K580 Irritable bowel syndrome with diarrhea: Secondary | ICD-10-CM

## 2023-09-06 LAB — C-REACTIVE PROTEIN: CRP: <1 mg/dL (ref 0.5–20.0)

## 2023-09-06 NOTE — Progress Notes (Unsigned)
 Marland Kitchen

## 2023-09-06 NOTE — Patient Instructions (Signed)
Your provider has requested that you go to the basement level for lab work before leaving today. Press "B" on the elevator. The lab is located at the first door on the left as you exit the elevator.  Follow low fodmap diet provided.  Try IBGard samples.  _______________________________________________________  If your blood pressure at your visit was 140/90 or greater, please contact your primary care physician to follow up on this.  _______________________________________________________  If you are age 77 or older, your body mass index should be between 23-30. Your Body mass index is 23.52 kg/m. If this is out of the aforementioned range listed, please consider follow up with your Primary Care Provider.  If you are age 89 or younger, your body mass index should be between 19-25. Your Body mass index is 23.52 kg/m. If this is out of the aformentioned range listed, please consider follow up with your Primary Care Provider.   ________________________________________________________  The [facility] providers would like to encourage you to use [facility] to communicate with providers for non-urgent requests or questions.  Due to long hold times on the telephone, sending your provider a message by [facility] may be a faster and more efficient way to get a response.  Please allow 48 business hours for a response.  Please remember that this is for non-urgent requests.   It was a pleasure to see you today!  Thank you for trusting me with your gastrointestinal care!    Scott E.Tomasa Rand, MD

## 2023-09-06 NOTE — Progress Notes (Signed)
Discussed the use of AI scribe software for clinical note transcription with the patient, who gave verbal consent to proceed.  HPI : Shawn Barajas is a 28 y.o. male with no significant medical history who is referred to [place] by Benjiman Core, MD for further evaluation of chronic abdominal pain.  He reports a four-year history of abdominal pain. The pain is described as sharp, discomforting, and achy, occurring at least three times a week, and often daily. The pain is not localized and is experienced throughout the abdomen. It is noted to occur both on an empty stomach and after meals, with no specific pattern identified. The patient also reports frequent flatulence throughout the night.  He has not noticed any particular foods causing more symptoms than others and has not tried making any dietary changes.  The patient's bowel habits are irregular, with frequent bowel movements, up to four or five times a day. The consistency of the stool varies, and occasional constipation is reported. The patient denies any relief of abdominal pain after bowel movements and often experiences a return of pain within thirty minutes, necessitating another bowel movement. The patient reported seeing red in his stool on one occasion and also experienced an episode of vomiting with a small amount of blood, which was a first-time occurrence.  The patient's weight has remained stable despite attempts to gain weight through regular gym workouts. The patient reports that eating too much often exacerbates the stomach pain. A few years ago, the patient was hospitalized for similar stomach issues and was advised to "improve his diet". Despite incorporating salads and healthier options, the patient did not notice any significant improvement in symptoms.   He has a history of frequent heartburn and acid regurgitation.  He went to the ED in October with abdominal pain and was prescribed omeprazole.  He has been taking omeprazole  since then, which has helped a lot with his heartburn, but has not helped his abdominal pain.  The patient has been taking dicyclomine, an antispasmodic medication, for about a month or two. While there has been some improvement in symptoms, the patient still experiences significant abdominal pain. The patient has been trying to adhere to the prescribed regimen of four doses a day but is still adjusting to this schedule.   No family history of GI malignancy or inflammatory bowel disease.     Past Medical History:  Diagnosis Date   COVID      Past Surgical History:  Procedure Laterality Date   INGUINAL HERNIA REPAIR     History reviewed. No pertinent family history. Social History   Tobacco Use   Smoking status: Never   Smokeless tobacco: Never  Vaping Use   Vaping status: Never Used  Substance Use Topics   Alcohol use: Not Currently    Comment: social    Drug use: Yes    Types: Marijuana    Comment: 1-2 per week   Current Outpatient Medications  Medication Sig Dispense Refill   dicyclomine (BENTYL) 10 MG capsule Take 1 capsule (10 mg total) by mouth 4 (four) times daily -  before meals and at bedtime. 120 capsule 1   omeprazole (PRILOSEC) 20 MG capsule Take 1 capsule (20 mg total) by mouth daily. 90 capsule 1   No current facility-administered medications for this visit.   No Known Allergies   Review of Systems: All systems reviewed and negative except where noted in HPI.    XR Finger Ring Right Result Date: 08/13/2023 X-rays of  the right ring finger, multiple views were conducted today X-rays demonstrate stable appearance of the PIP joint, well located in all planes without significant bony abnormalities appreciated.   Physical Exam: BP 120/80 (BP Location: Left Arm, Patient Position: Sitting, Cuff Size: Normal)   Pulse 87   Ht 5\' 9"  (1.753 m)   Wt 159 lb 4 oz (72.2 kg)   SpO2 97%   BMI 23.52 kg/m  Constitutional: Pleasant,well-developed, African American  male in no acute distress. HEENT: Normocephalic and atraumatic. Conjunctivae are normal. No scleral icterus. Neck supple.  Cardiovascular: Normal rate, regular rhythm.  Pulmonary/chest: Effort normal and breath sounds normal. No wheezing, rales or rhonchi. Abdominal: Soft, nondistended, nontender. Bowel sounds active throughout. There are no masses palpable. No hepatomegaly. Extremities: no edema Lymphadenopathy: No cervical adenopathy noted. Neurological: Alert and oriented to person place and time. Skin: Skin is warm and dry. No rashes noted. Psychiatric: Normal mood and affect. Behavior is normal.  CBC    Component Value Date/Time   WBC 5.6 08/03/2023 1018   WBC 7.3 06/04/2023 1443   RBC 4.90 08/03/2023 1018   RBC 5.22 06/04/2023 1443   HGB 13.4 08/03/2023 1018   HCT 42.2 08/03/2023 1018   PLT 191 08/03/2023 1018   MCV 86 08/03/2023 1018   MCH 27.3 08/03/2023 1018   MCH 27.0 06/04/2023 1443   MCHC 31.8 08/03/2023 1018   MCHC 32.5 06/04/2023 1443   RDW 12.8 08/03/2023 1018   LYMPHSABS 2.4 08/03/2023 1018   EOSABS 0.1 08/03/2023 1018   BASOSABS 0.1 08/03/2023 1018    CMP     Component Value Date/Time   NA 140 08/03/2023 1018   K 4.4 08/03/2023 1018   CL 104 08/03/2023 1018   CO2 23 08/03/2023 1018   GLUCOSE 133 (H) 08/03/2023 1018   GLUCOSE 86 06/04/2023 1443   BUN 11 08/03/2023 1018   CREATININE 1.03 08/03/2023 1018   CALCIUM 9.1 08/03/2023 1018   PROT 6.6 08/03/2023 1018   ALBUMIN 4.3 08/03/2023 1018   AST 27 08/03/2023 1018   ALT 24 08/03/2023 1018   ALKPHOS 81 08/03/2023 1018   BILITOT 0.5 08/03/2023 1018   GFRNONAA >60 06/04/2023 1443       Latest Ref Rng & Units 08/03/2023   10:18 AM 06/04/2023    2:43 PM  CBC EXTENDED  WBC 3.4 - 10.8 x10E3/uL 5.6  7.3   RBC 4.14 - 5.80 x10E6/uL 4.90  5.22   Hemoglobin 13.0 - 17.7 g/dL 40.9  81.1   HCT 91.4 - 51.0 % 42.2  43.4   Platelets 150 - 450 x10E3/uL 191  201   NEUT# 1.4 - 7.0 x10E3/uL 2.6    Lymph# 0.7 -  3.1 x10E3/uL 2.4        ASSESSMENT AND PLAN:  28 year old male with 4-year history of chronic recurring abdominal pain and frequent, erratic bowel movements.  Clinical history most consistent with irritable bowel syndrome. We discussed the proposed pathophysiology of IBS and gut brain axis disorders in general.  We discussed management of IBS, to include use of medications to improve bowel habits, as needed pain medicine, centrally acting neuromodulators, role of empiric dietary modifications to include a low FODMAP diet gluten-free diet, as well as the role of cognitive therapies.  We discussed the goals of IBS management, namely to minimize the impact of GI symptoms on quality of life.  Will exclude other potential mimickers of IBS to include celiac disease, H. pylori infection, alpha gal syndrome and check CRP  to assess for possible Crohn's. The patient was provided handouts with further information on IBS Explained the use of antispasmodic medications like dicyclomine and alternative treatments like IB guard (peppermint oil).  - Continue dicyclomine, consider increasing to two capsules at once if needed - Provide handout on low FODMAP diet - Order blood tests for celiac disease, H. pylori, alpha-gal syndrome, and CRP - Perform stool tests for H. pylori - Schedule follow-up in two months to reassess symptoms and test results -If no improvement in symptoms, consider trying Viberzi at follow-up.  Gastroesophageal Reflux Disease (GERD) Severe heartburn and reflux significantly improved with omeprazole. Symptoms include chest discomfort and burning sensation. Current medication regimen includes omeprazole taken inconsistently. Discussed the importance of consistent daily use of omeprazole for effective management of reflux symptoms. - Continue omeprazole daily as prescribed  Follow-up - Schedule follow-up appointment in two months.        Benjiman Core, MD d

## 2023-09-07 ENCOUNTER — Other Ambulatory Visit: Payer: Medicaid Other

## 2023-09-07 DIAGNOSIS — K580 Irritable bowel syndrome with diarrhea: Secondary | ICD-10-CM

## 2023-09-09 LAB — H. PYLORI ANTIGEN, STOOL: H pylori Ag, Stl: POSITIVE — AB

## 2023-09-11 LAB — ALPHA-GAL PANEL
Allergen, Mutton, f88: <0.1 kU/L
Allergen, Pork, f26: <0.1 kU/L
Beef: <0.1 kU/L
CLASS: 0
CLASS: 0
Class: 0
GALACTOSE-ALPHA-1,3-GALACTOSE IGE*: <0.1 kU/L (ref <0.10)

## 2023-09-11 LAB — IGA: Immunoglobulin A: 271 mg/dL (ref 47–310)

## 2023-09-11 LAB — INTERPRETATION:

## 2023-09-11 LAB — TISSUE TRANSGLUTAMINASE, IGA: (tTG) Ab, IgA: <1 U/mL

## 2023-09-12 ENCOUNTER — Other Ambulatory Visit: Payer: Self-pay

## 2023-09-12 ENCOUNTER — Other Ambulatory Visit (HOSPITAL_BASED_OUTPATIENT_CLINIC_OR_DEPARTMENT_OTHER): Payer: Self-pay

## 2023-09-12 ENCOUNTER — Encounter: Payer: Self-pay | Admitting: Gastroenterology

## 2023-09-12 DIAGNOSIS — A048 Other specified bacterial intestinal infections: Secondary | ICD-10-CM

## 2023-09-12 MED ORDER — BISMUTH 262 MG PO CHEW
524.0000 mg | CHEWABLE_TABLET | Freq: Four times a day (QID) | ORAL | 0 refills | Status: DC
  Filled 2023-09-12: qty 30, 5d supply, fill #0
  Filled 2023-09-25: qty 30, 5d supply, fill #1

## 2023-09-12 MED ORDER — OMEPRAZOLE 40 MG PO CPDR
40.0000 mg | DELAYED_RELEASE_CAPSULE | Freq: Two times a day (BID) | ORAL | 0 refills | Status: DC
  Filled 2023-09-12 (×2): qty 28, 14d supply, fill #0

## 2023-09-12 MED ORDER — DOXYCYCLINE HYCLATE 100 MG PO CAPS
100.0000 mg | ORAL_CAPSULE | Freq: Two times a day (BID) | ORAL | 0 refills | Status: DC
  Filled 2023-09-12 (×2): qty 28, 14d supply, fill #0

## 2023-09-12 MED ORDER — METRONIDAZOLE 250 MG PO TABS
250.0000 mg | ORAL_TABLET | Freq: Four times a day (QID) | ORAL | 0 refills | Status: DC
  Filled 2023-09-12: qty 56, 14d supply, fill #0

## 2023-09-12 NOTE — Progress Notes (Signed)
Shawn Barajas, Your test for alpha gal syndrome, celiac disease and your inflammatory marker were all normal.  As previously mentioned, your H. pylori stool antigen was positive.  I am hopeful that many of your symptoms will improve if we are able to successfully eradicate this bacteria.

## 2023-09-12 NOTE — Progress Notes (Signed)
Shawn Barajas, Your stool test was positive for the presence of H. pylori, a common bacteria that can live in the stomach.  This bacteria can cause chronic GI symptoms such as abdominal pain, nausea/vomiting, bloating.  Additionally, it can increase the risk for ulcers in the stomach as well as stomach cancer.  It can also contribute to iron malabsorption and iron deficiency.  When found, it is recommended that the bacteria be eradicated. The bacteria can sometimes be difficult to eradicate due to bacterial resistance, so it is very important that you take the medication regiment exactly as prescribed.  1) Omeprazole 40 mg 2 times a day x 14 d 2) Pepto Bismol 2 tabs (262 mg each) 4 times a day x 14 d 3) Metronidazole 250 mg 4 times a day x 14 d 4) doxycycline 100 mg 2 times a day x 14 d  After 14 days, ok to stop omeprazole.  4 weeks after treatment completed, check H. Pylori stool antigen to confirm eradication (must be off acid suppression therapy for 2 weeks prior to specimen submission)  Bonita Quin, Please place the prescriptions above and orders for repeat H. pylori stool test in 6 weeks

## 2023-09-13 ENCOUNTER — Other Ambulatory Visit (HOSPITAL_BASED_OUTPATIENT_CLINIC_OR_DEPARTMENT_OTHER): Payer: Self-pay

## 2023-09-14 ENCOUNTER — Other Ambulatory Visit (HOSPITAL_BASED_OUTPATIENT_CLINIC_OR_DEPARTMENT_OTHER): Payer: Self-pay

## 2023-09-25 ENCOUNTER — Other Ambulatory Visit (HOSPITAL_BASED_OUTPATIENT_CLINIC_OR_DEPARTMENT_OTHER): Payer: Self-pay

## 2023-09-26 ENCOUNTER — Other Ambulatory Visit: Payer: Self-pay | Admitting: Gastroenterology

## 2023-09-27 ENCOUNTER — Other Ambulatory Visit (HOSPITAL_BASED_OUTPATIENT_CLINIC_OR_DEPARTMENT_OTHER): Payer: Self-pay

## 2023-09-27 ENCOUNTER — Encounter (HOSPITAL_BASED_OUTPATIENT_CLINIC_OR_DEPARTMENT_OTHER): Payer: Self-pay

## 2023-10-23 ENCOUNTER — Ambulatory Visit: Payer: Medicaid Other | Admitting: Gastroenterology

## 2023-11-25 ENCOUNTER — Other Ambulatory Visit (HOSPITAL_BASED_OUTPATIENT_CLINIC_OR_DEPARTMENT_OTHER): Payer: Self-pay | Admitting: Family Medicine

## 2023-11-25 DIAGNOSIS — K580 Irritable bowel syndrome with diarrhea: Secondary | ICD-10-CM

## 2023-11-26 ENCOUNTER — Other Ambulatory Visit: Payer: Self-pay

## 2023-11-27 ENCOUNTER — Other Ambulatory Visit (HOSPITAL_COMMUNITY): Payer: Self-pay

## 2023-11-27 MED ORDER — DICYCLOMINE HCL 10 MG PO CAPS
10.0000 mg | ORAL_CAPSULE | Freq: Three times a day (TID) | ORAL | 0 refills | Status: DC
Start: 2023-11-27 — End: 2024-01-15
  Filled 2023-11-27 – 2023-11-28 (×2): qty 120, 30d supply, fill #0

## 2023-11-28 ENCOUNTER — Other Ambulatory Visit (HOSPITAL_BASED_OUTPATIENT_CLINIC_OR_DEPARTMENT_OTHER): Payer: Self-pay

## 2023-11-30 ENCOUNTER — Other Ambulatory Visit (HOSPITAL_COMMUNITY): Payer: Self-pay

## 2023-12-11 ENCOUNTER — Other Ambulatory Visit (HOSPITAL_BASED_OUTPATIENT_CLINIC_OR_DEPARTMENT_OTHER): Payer: Self-pay

## 2023-12-20 ENCOUNTER — Ambulatory Visit (INDEPENDENT_AMBULATORY_CARE_PROVIDER_SITE_OTHER): Admitting: Gastroenterology

## 2023-12-20 ENCOUNTER — Encounter: Payer: Self-pay | Admitting: Gastroenterology

## 2023-12-20 VITALS — BP 120/64 | HR 59 | Ht 68.0 in | Wt 162.0 lb

## 2023-12-20 DIAGNOSIS — K219 Gastro-esophageal reflux disease without esophagitis: Secondary | ICD-10-CM

## 2023-12-20 DIAGNOSIS — A048 Other specified bacterial intestinal infections: Secondary | ICD-10-CM | POA: Diagnosis not present

## 2023-12-20 DIAGNOSIS — R109 Unspecified abdominal pain: Secondary | ICD-10-CM

## 2023-12-20 DIAGNOSIS — B9681 Helicobacter pylori [H. pylori] as the cause of diseases classified elsewhere: Secondary | ICD-10-CM

## 2023-12-20 NOTE — Progress Notes (Signed)
 HPI : Shawn Barajas is a 28 y.o. male who presents for follow-up.  He was initially seen by me in January of this year with chronic symptoms of abdominal pain and irregular bowel habits.  Evaluation was notable for positive H. pylori stool test, and he was treated with quadruple therapy.  A repeat stool test has not been submitted to confirm eradication. Blood tests for celiac disease and alpha-gal syndrome were negative. CRP was normal.   His GERD symptoms were well-controlled at time of last visit on omeprazole , though this did not improve his abdominal pain.  Dicyclomine  provides some improvement in symptoms, but he was still experiencing pain.  Today, patient states his symptoms are much improved. He completed treatment for H. Pylori but has not done the eradication study. He now lives in [place] and wants to limit his driving to [place].   His abdominal pain is much better than it was at the last visit but he does still have occasional pain. He has identified that his diet can be a trigger for this, particularly eating too much and eating sweets. He is trying to eat healthier. Stress also seems to be a trigger.   Since completing quadruple therapy he has not had any nocturnal pain. He is not having any breakthrough heartburn or acid reflux on omeprazole .   He has a bowel movement every other day and denies any diarrhea, constipation, bloody stools, or melena. He was having diarrhea associated with his abdominal pain, but this has resolved since pain is improved.   Denies nausea/vomiting.   Reports he had STD testing last month which was negative.    Past Medical History:  Diagnosis Date   COVID      Past Surgical History:  Procedure Laterality Date   INGUINAL HERNIA REPAIR     No family history on file. Social History   Tobacco Use   Smoking status: Never   Smokeless tobacco: Never  Vaping Use   Vaping status: Never Used  Substance Use Topics   Alcohol use: Not  Currently    Comment: social    Drug use: Yes    Types: Marijuana    Comment: 1-2 per week   Current Outpatient Medications  Medication Sig Dispense Refill   Bismuth  262 MG CHEW Chew 2 tablets (524 mg) by mouth in the morning, at noon, in the evening, and at bedtime. 112 tablet 0   dicyclomine  (BENTYL ) 10 MG capsule Take 1 capsule (10 mg total) by mouth 4 (four) times daily -  before meals and at bedtime. 120 capsule 0   doxycycline  (VIBRAMYCIN ) 100 MG capsule Take 1 capsule (100 mg total) by mouth 2 (two) times daily. 28 capsule 0   metroNIDAZOLE  (FLAGYL ) 250 MG tablet Take 1 tablet (250 mg total) by mouth 4 (four) times daily. 56 tablet 0   omeprazole  (PRILOSEC) 20 MG capsule Take 1 capsule (20 mg total) by mouth daily. 90 capsule 1   omeprazole  (PRILOSEC) 40 MG capsule Take 1 capsule (40 mg total) by mouth 2 (two) times daily. 28 capsule 0   No current facility-administered medications for this visit.   No Known Allergies   Review of Systems: All systems reviewed and negative except where noted in HPI.    Physical Exam: BP 120/64   Pulse (!) 59   Ht 5\' 8"  (1.727 m)   Wt 162 lb (73.5 kg)   BMI 24.63 kg/m   Constitutional: Pleasant, well-developed African American male in no acute distress. HEENT:  Normocephalic and atraumatic. Conjunctivae are normal. No scleral icterus. Neck supple.  Cardiovascular: Normal rate, regular rhythm.  Pulmonary/chest: Effort normal and breath sounds normal. No wheezing, rales or rhonchi. Abdominal: Soft, nondistended, nontender. Bowel sounds active throughout. There are no masses palpable. No hepatomegaly. Extremities: no edema Lymphadenopathy: No cervical adenopathy noted. Neurological: Alert and oriented to person place and time. Skin: Skin is warm and dry. No rashes noted. Psychiatric: Normal mood and affect. Behavior is normal.  CBC    Component Value Date/Time   WBC 5.6 08/03/2023 1018   WBC 7.3 06/04/2023 1443   RBC 4.90 08/03/2023  1018   RBC 5.22 06/04/2023 1443   HGB 13.4 08/03/2023 1018   HCT 42.2 08/03/2023 1018   PLT 191 08/03/2023 1018   MCV 86 08/03/2023 1018   MCH 27.3 08/03/2023 1018   MCH 27.0 06/04/2023 1443   MCHC 31.8 08/03/2023 1018   MCHC 32.5 06/04/2023 1443   RDW 12.8 08/03/2023 1018   LYMPHSABS 2.4 08/03/2023 1018   EOSABS 0.1 08/03/2023 1018   BASOSABS 0.1 08/03/2023 1018    CMP     Component Value Date/Time   NA 140 08/03/2023 1018   K 4.4 08/03/2023 1018   CL 104 08/03/2023 1018   CO2 23 08/03/2023 1018   GLUCOSE 133 (H) 08/03/2023 1018   GLUCOSE 86 06/04/2023 1443   BUN 11 08/03/2023 1018   CREATININE 1.03 08/03/2023 1018   CALCIUM 9.1 08/03/2023 1018   PROT 6.6 08/03/2023 1018   ALBUMIN 4.3 08/03/2023 1018   AST 27 08/03/2023 1018   ALT 24 08/03/2023 1018   ALKPHOS 81 08/03/2023 1018   BILITOT 0.5 08/03/2023 1018   GFRNONAA >60 06/04/2023 1443       Latest Ref Rng & Units 08/03/2023   10:18 AM 06/04/2023    2:43 PM  CBC EXTENDED  WBC 3.4 - 10.8 x10E3/uL 5.6  7.3   RBC 4.14 - 5.80 x10E6/uL 4.90  5.22   Hemoglobin 13.0 - 17.7 g/dL 16.1  09.6   HCT 04.5 - 51.0 % 42.2  43.4   Platelets 150 - 450 x10E3/uL 191  201   NEUT# 1.4 - 7.0 x10E3/uL 2.6    Lymph# 0.7 - 3.1 x10E3/uL 2.4       ASSESSMENT AND PLAN:  H. Pylori infection - Patient completed treatment with quadruple therapy and symptoms have improved. - Check [facility] H. Pylori eradication study today.   GERD - Symptoms controlled on omeprazole  20mg  daily. Will continue this for now, can discuss trying to wean off PPI at follow up.  IBS-D? - Symptoms much improved since last visit. No longer having any diarrhea. Still having some abdominal pain occasionally.  - Continue dicyclomine  10mg  PRN q6hrs.   Joe Gee E. Cherryl Corona, MD [facility]  Patient seen with PA [facility]  [place], [address] [place], [place]

## 2023-12-20 NOTE — Patient Instructions (Signed)
 Continue current medications.  _______________________________________________________  If your blood pressure at your visit was 140/90 or greater, please contact your primary care physician to follow up on this.  _______________________________________________________  If you are age 30 or older, your body mass index should be between 23-30. Your Body mass index is 24.63 kg/m. If this is out of the aforementioned range listed, please consider follow up with your Primary Care Provider.  If you are age 43 or younger, your body mass index should be between 19-25. Your Body mass index is 24.63 kg/m. If this is out of the aformentioned range listed, please consider follow up with your Primary Care Provider.   ________________________________________________________  The [facility] providers would like to encourage you to use MYCHART to communicate with providers for non-urgent requests or questions.  Due to long hold times on the telephone, sending your provider a message by [facility] may be a faster and more efficient way to get a response.  Please allow 48 business hours for a response.  Please remember that this is for non-urgent requests.  _______________________________________________________   It was a pleasure to see you today!  Thank you for trusting me with your gastrointestinal care!    Scott E.Cherryl Corona, MD

## 2023-12-29 ENCOUNTER — Encounter: Payer: Self-pay | Admitting: Gastroenterology

## 2024-01-01 ENCOUNTER — Encounter: Payer: Self-pay | Admitting: Gastroenterology

## 2024-01-13 ENCOUNTER — Other Ambulatory Visit (HOSPITAL_BASED_OUTPATIENT_CLINIC_OR_DEPARTMENT_OTHER): Payer: Self-pay | Admitting: Gastroenterology

## 2024-01-13 DIAGNOSIS — K580 Irritable bowel syndrome with diarrhea: Secondary | ICD-10-CM

## 2024-01-15 ENCOUNTER — Other Ambulatory Visit (HOSPITAL_COMMUNITY): Payer: Self-pay

## 2024-01-15 ENCOUNTER — Other Ambulatory Visit (HOSPITAL_BASED_OUTPATIENT_CLINIC_OR_DEPARTMENT_OTHER): Payer: Self-pay

## 2024-01-15 ENCOUNTER — Other Ambulatory Visit (HOSPITAL_BASED_OUTPATIENT_CLINIC_OR_DEPARTMENT_OTHER): Payer: Self-pay | Admitting: Family Medicine

## 2024-01-15 ENCOUNTER — Encounter (HOSPITAL_BASED_OUTPATIENT_CLINIC_OR_DEPARTMENT_OTHER): Payer: Self-pay | Admitting: Family Medicine

## 2024-01-15 ENCOUNTER — Ambulatory Visit: Payer: Self-pay

## 2024-01-15 DIAGNOSIS — K580 Irritable bowel syndrome with diarrhea: Secondary | ICD-10-CM

## 2024-01-15 MED ORDER — OMEPRAZOLE 20 MG PO CPDR
20.0000 mg | DELAYED_RELEASE_CAPSULE | Freq: Every day | ORAL | 1 refills | Status: DC
  Filled 2024-01-15: qty 90, 90d supply, fill #0

## 2024-01-15 MED ORDER — DICYCLOMINE HCL 10 MG PO CAPS
10.0000 mg | ORAL_CAPSULE | Freq: Three times a day (TID) | ORAL | 0 refills | Status: DC
  Filled 2024-01-15: qty 120, 30d supply, fill #0

## 2024-01-15 NOTE — Telephone Encounter (Signed)
 Pt calls today asking if he should continue taking dicyclomine  and omeprazole . RN advised the pt that both of these medications were refilled and sent to his pharmacy today. Pt verbalized understanding but would like to know if it's necessary that he continue taking them, especially given the fact the prescriptions said no refills. Pt denies any symptoms at this time. RN advised pt RN would relay question to the office for follow-up.   Reason for Disposition  Caller has medicine question only, adult not sick, AND triager answers question  Answer Assessment - Initial Assessment Questions 1. NAME of MEDICINE: "What medicine(s) are you calling about?"     Dicyclomine  and omeprazole  2. QUESTION: "What is your question?" (e.g., double dose of medicine, side effect)     Pt wondering if she needs to be taking both of these medications 3. PRESCRIBER: "Who prescribed the medicine?" Reason: if prescribed by specialist, call should be referred to that group.     Caudle 4. SYMPTOMS: "Do you have any symptoms?" If Yes, ask: "What symptoms are you having?"  "How bad are the symptoms (e.g., mild, moderate, severe)     Denies any symptoms  Protocols used: Medication Question Call-A-AH

## 2024-01-15 NOTE — Telephone Encounter (Signed)
 Duplicate CRM. Patient was able to connect with NT earlier today. See recent encounter.   Copied from CRM [id number]. Topic: Clinical - Medical Advice >> Jan 15, 2024 10:52 AM Shawn Barajas wrote: Reason for CRM: Pt called requesting to speak to a nurse, says he has a general question and did not disclose any further when asked.   Please advise   Best contact: [phone]

## 2024-01-15 NOTE — Addendum Note (Signed)
 Addended by: Eve Hinders on: 01/15/2024 01:58 PM   Modules accepted: Orders

## 2024-01-15 NOTE — Telephone Encounter (Signed)
 This RN made first attempt to contact patient. No answer, LVM. Routing for additional attempts.   CRM # [id number] Patient is wanting to know if he should continue taking the dicyclomine  (BENTYL ) 10 MG capsule since he has no remaining refills

## 2024-03-19 ENCOUNTER — Other Ambulatory Visit (HOSPITAL_COMMUNITY)
Admission: RE | Admit: 2024-03-19 | Discharge: 2024-03-19 | Disposition: A | Discharge status: Home / Self Care | Source: Ambulatory Visit | Attending: Family Medicine | Admitting: Family Medicine

## 2024-03-19 ENCOUNTER — Encounter (HOSPITAL_BASED_OUTPATIENT_CLINIC_OR_DEPARTMENT_OTHER): Payer: Self-pay | Admitting: Family Medicine

## 2024-03-19 ENCOUNTER — Ambulatory Visit (INDEPENDENT_AMBULATORY_CARE_PROVIDER_SITE_OTHER): Admitting: Family Medicine

## 2024-03-19 VITALS — BP 128/80 | HR 57 | Ht 69.0 in | Wt 161.2 lb

## 2024-03-19 DIAGNOSIS — Z114 Encounter for screening for human immunodeficiency virus [HIV]: Secondary | ICD-10-CM

## 2024-03-19 DIAGNOSIS — Z1159 Encounter for screening for other viral diseases: Secondary | ICD-10-CM | POA: Diagnosis not present

## 2024-03-19 DIAGNOSIS — H547 Unspecified visual loss: Secondary | ICD-10-CM

## 2024-03-19 DIAGNOSIS — Z113 Encounter for screening for infections with a predominantly sexual mode of transmission: Secondary | ICD-10-CM | POA: Insufficient documentation

## 2024-03-19 NOTE — Progress Notes (Signed)
 Acute Care Office Visit  Subjective:   Shawn Barajas 28-Oct-1995 03/19/2024  Chief Complaint  Patient presents with   std testing    Pt is here to have STD testing done.    HPI: STD SCREENING: Shawn Barajas presents for STD screening.   Sexual activity: yes, last sexually active 02/22/24 Contraception: sometimes Recent unprotected intercourse: no Recent known exposure to STD's: no History of sexually transmitted diseases: yes  Genital lesions: no Genital discharge: no Dysuria: no Swollen lymph nodes: no Fevers: no Rash: no  VISION IMPAIRMENT:  Pt brought paperwork with him requesting a handicap sticker. He does wear glasses, and although he does not carry a diagnosis of being legally blind, pt states he feels blind if he doesn't wear them. Pt states he recently switched from optometrist at [facility] to [facility]. He sees his eye doctor when he needs to have prescription updated. He expressed his distaste for [facility], and is requesting PCP to sign ppw for handicap placard.   The following portions of the patient's history were reviewed and updated as appropriate: past medical history, past surgical history, family history, social history, allergies, medications, and problem list.   Patient Active Problem List   Diagnosis Date Noted   Gastroesophageal reflux disease with esophagitis 06/17/2023   Irritable bowel syndrome with diarrhea 06/17/2023   Past Medical History:  Diagnosis Date   COVID    H. pylori infection    Past Surgical History:  Procedure Laterality Date   INGUINAL HERNIA REPAIR     Family History  Problem Relation Age of Onset   Other Mother        hx unknown   Other Father        hx unknown   Outpatient Medications Prior to Visit  Medication Sig Dispense Refill   dicyclomine  (BENTYL ) 10 MG capsule Take 1 capsule (10 mg total) by mouth 4 (four) times daily -  before meals and at bedtime. 120 capsule 0   No facility-administered  medications prior to visit.   No Known Allergies   ROS: A complete ROS was performed with pertinent positives/negatives noted in the HPI. The remainder of the ROS are negative.    Objective:   Today's Vitals   03/19/24 1501  BP: 128/80  Pulse: (!) 57  SpO2: 100%  Weight: 161 lb 3.2 oz (73.1 kg)  Height: 5' 9 (1.753 m)    GENERAL: Well-appearing, in NAD. Well nourished.  SKIN: Pink, warm and dry. No rash, lesion, ulceration, or ecchymoses.  Head: Normocephalic. NECK: Trachea midline.  THROAT: Mucous membranes pink and moist.  RESPIRATORY: Chest wall symmetrical. Respirations even and non-labored.  MSK: Muscle tone and strength appropriate for age. Joints w/o tenderness, redness, or swelling.  EXTREMITIES: Without clubbing, cyanosis, or edema.  GU: Pt deferred.  NEUROLOGIC: No motor or sensory deficits. Steady, even gait. C2-C12 intact. Glasses present.  PSYCH/MENTAL STATUS: Alert, oriented x 3. Cooperative, appropriate mood and affect.   No results found for any visits on 03/19/24.    Assessment & Plan:  1. Screen for sexually transmitted diseases (Primary) Asymptomatic. Pt requesting screening.  - Urine cytology ancillary only - RPR  2. Encounter for hepatitis C screening test for low risk patient Asymptomatic. Pt requesting screening.  - Hepatitis C antibody  3. Encounter for screening for human immunodeficiency virus (HIV) Asymptomatic. Pt requesting screening.  - HIV Antibody (routine testing w rflx)  4. Vision impairment Upon further review of handicap paperwork, the paperwork  reads that it should be completed and signed by a licensed ophthalmologist and/or the division of legally blind services. Pt advised to reach out to his ophthalmologist regarding signature for ppw.    No orders of the defined types were placed in this encounter.  Lab Orders         Hepatitis C antibody         HIV Antibody (routine testing w rflx)         RPR     No images are  attached to the encounter or orders placed in the encounter.  Return if symptoms worsen or fail to improve.    Patient to reach out to office if new, worrisome, or unresolved symptoms arise or if no improvement in patient's condition. Patient verbalized understanding and is agreeable to treatment plan. All questions answered to patient's satisfaction.   Thersia Stark, FNP-C

## 2024-03-19 NOTE — Progress Notes (Signed)
 Acute Care Office Visit  Subjective:   Shawn Barajas 05/07/1996 03/19/2024  Chief Complaint  Patient presents with   std testing    Pt is here to have STD testing done.    HPI: STD SCREENING: Shawn Barajas presents for STD screening.   Sexual activity: yes, last sexually active 02/22/24 Contraception: sometimes Recent unprotected intercourse: no Recent known exposure to STD's: no History of sexually transmitted diseases: yes  Genital lesions: no Genital discharge: no Dysuria: no Swollen lymph nodes: no Fevers: no Rash: no  VISION IMPAIRMENT:  Pt brought paperwork with him requesting a handicap sticker. He does wear glasses, and although he does not carry a diagnosis of being legally blind, pt states he feels blind if he doesn't wear them. Pt states he recently switched from ophthalmologist at [facility] to [facility]. He expressed his distaste for [facility], and is requesting PCP to sign ppw.   The following portions of the patient's history were reviewed and updated as appropriate: past medical history, past surgical history, family history, social history, allergies, medications, and problem list.   Patient Active Problem List   Diagnosis Date Noted   Gastroesophageal reflux disease with esophagitis 06/17/2023   Irritable bowel syndrome with diarrhea 06/17/2023   Past Medical History:  Diagnosis Date   COVID    H. pylori infection    Past Surgical History:  Procedure Laterality Date   INGUINAL HERNIA REPAIR     Family History  Problem Relation Age of Onset   Other Mother        hx unknown   Other Father        hx unknown   Outpatient Medications Prior to Visit  Medication Sig Dispense Refill   dicyclomine  (BENTYL ) 10 MG capsule Take 1 capsule (10 mg total) by mouth 4 (four) times daily -  before meals and at bedtime. 120 capsule 0   No facility-administered medications prior to visit.   No Known Allergies   ROS: A complete ROS was  performed with pertinent positives/negatives noted in the HPI. The remainder of the ROS are negative.    Objective:   Today's Vitals   03/19/24 1501  BP: 128/80  Pulse: (!) 57  SpO2: 100%  Weight: 73.1 kg  Height: 5' 9 (1.753 m)    GENERAL: Well-appearing, in NAD. Well nourished.  SKIN: Pink, warm and dry. No rash, lesion, ulceration, or ecchymoses.  Head: Normocephalic. NECK: Trachea midline.  THROAT: Mucous membranes pink and moist.  RESPIRATORY: Chest wall symmetrical. Respirations even and non-labored. Breath sounds clear to auscultation bilaterally.  CARDIAC: S1, S2 present, regular rate and rhythm without murmur or gallops. Peripheral pulses 2+ bilaterally.  MSK: Muscle tone and strength appropriate for age. Joints w/o tenderness, redness, or swelling.  EXTREMITIES: Without clubbing, cyanosis, or edema.  GU: Pt deferred.  NEUROLOGIC: No motor or sensory deficits. Steady, even gait. C2-C12 intact.  PSYCH/MENTAL STATUS: Alert, oriented x 3. Cooperative, appropriate mood and affect.   No results found for any visits on 03/19/24.    Assessment & Plan:  1. Screen for sexually transmitted diseases (Primary) Asymptomatic. Pt requesting screening.  - Urine cytology ancillary only - RPR  2. Encounter for hepatitis C screening test for low risk patient Asymptomatic. Pt requesting screening.  - Hepatitis C antibody  3. Encounter for screening for human immunodeficiency virus (HIV) Asymptomatic. Pt requesting screening.  - HIV Antibody (routine testing w rflx)  4. Vision impairment Upon further review of handicap paperwork,  the paperwork reads that it should be completed and signed by a licensed ophthalmologist and/or the division of legally blind services. Pt advised to reach out to his ophthalmologist regarding signature for ppw.    No orders of the defined types were placed in this encounter.  Lab Orders         Hepatitis C antibody         HIV Antibody (routine  testing w rflx)         RPR     No images are attached to the encounter or orders placed in the encounter.  Return if symptoms worsen or fail to improve.    Patient to reach out to office if new, worrisome, or unresolved symptoms arise or if no improvement in patient's condition. Patient verbalized understanding and is agreeable to treatment plan. All questions answered to patient's satisfaction.   Treatment plan and recommendation(s) reviewed by supervising preceptor, Thersia CLEMENTEEN Stark, FNP-C, prior to clinic discharge.    Rosina Ada, BSN, RN DNP Student

## 2024-03-20 ENCOUNTER — Ambulatory Visit (HOSPITAL_BASED_OUTPATIENT_CLINIC_OR_DEPARTMENT_OTHER): Payer: Self-pay | Admitting: Family Medicine

## 2024-03-20 LAB — URINE CYTOLOGY ANCILLARY ONLY
Chlamydia: NEGATIVE
Comment: NEGATIVE
Comment: NEGATIVE
Comment: NORMAL
Neisseria Gonorrhea: NEGATIVE
Trichomonas: NEGATIVE

## 2024-03-20 LAB — HIV ANTIBODY (ROUTINE TESTING W REFLEX): HIV Screen 4th Generation wRfx: NONREACTIVE

## 2024-03-20 LAB — RPR: RPR Ser Ql: NONREACTIVE

## 2024-03-20 LAB — HEPATITIS C ANTIBODY: Hep C Virus Ab: NONREACTIVE

## 2024-03-20 NOTE — Progress Notes (Signed)
 Hep C, HIV and RPR are negative. Awaiting urine cytology.

## 2024-03-21 NOTE — Progress Notes (Signed)
STD testing is negative

## 2024-03-25 ENCOUNTER — Other Ambulatory Visit (HOSPITAL_BASED_OUTPATIENT_CLINIC_OR_DEPARTMENT_OTHER): Payer: Self-pay | Admitting: Family Medicine

## 2024-03-25 ENCOUNTER — Other Ambulatory Visit (HOSPITAL_BASED_OUTPATIENT_CLINIC_OR_DEPARTMENT_OTHER): Payer: Self-pay

## 2024-03-25 ENCOUNTER — Other Ambulatory Visit: Payer: Self-pay

## 2024-03-25 DIAGNOSIS — K580 Irritable bowel syndrome with diarrhea: Secondary | ICD-10-CM

## 2024-03-25 MED ORDER — DICYCLOMINE HCL 10 MG PO CAPS
10.0000 mg | ORAL_CAPSULE | Freq: Three times a day (TID) | ORAL | 2 refills | Status: DC
Start: 2024-03-25 — End: 2024-03-25
  Filled 2024-03-25: qty 120, 30d supply, fill #0

## 2024-03-25 MED ORDER — OMEPRAZOLE 20 MG PO CPDR
20.0000 mg | DELAYED_RELEASE_CAPSULE | Freq: Every day | ORAL | 2 refills | Status: DC
  Filled 2024-03-25 – 2024-03-26 (×2): qty 90, 90d supply, fill #0
  Filled 2024-07-12: qty 90, 90d supply, fill #1

## 2024-03-25 NOTE — Telephone Encounter (Signed)
 Please see mychart message sent by pt and advise.

## 2024-03-26 ENCOUNTER — Other Ambulatory Visit: Payer: Self-pay

## 2024-03-26 ENCOUNTER — Other Ambulatory Visit (HOSPITAL_BASED_OUTPATIENT_CLINIC_OR_DEPARTMENT_OTHER): Payer: Self-pay

## 2024-04-04 ENCOUNTER — Ambulatory Visit (HOSPITAL_BASED_OUTPATIENT_CLINIC_OR_DEPARTMENT_OTHER): Admitting: Family Medicine

## 2024-05-19 ENCOUNTER — Other Ambulatory Visit (HOSPITAL_BASED_OUTPATIENT_CLINIC_OR_DEPARTMENT_OTHER): Payer: Self-pay | Admitting: Family Medicine

## 2024-05-19 ENCOUNTER — Encounter (HOSPITAL_BASED_OUTPATIENT_CLINIC_OR_DEPARTMENT_OTHER): Payer: Self-pay | Admitting: Family Medicine

## 2024-05-19 ENCOUNTER — Encounter: Payer: Self-pay | Admitting: Family Medicine

## 2024-05-19 ENCOUNTER — Other Ambulatory Visit (HOSPITAL_BASED_OUTPATIENT_CLINIC_OR_DEPARTMENT_OTHER): Payer: Self-pay

## 2024-05-19 ENCOUNTER — Ambulatory Visit: Admitting: Family Medicine

## 2024-05-19 VITALS — BP 120/86 | HR 74 | Temp 98.7°F | Wt 159.4 lb

## 2024-05-19 DIAGNOSIS — R059 Cough, unspecified: Secondary | ICD-10-CM | POA: Diagnosis not present

## 2024-05-19 DIAGNOSIS — J029 Acute pharyngitis, unspecified: Secondary | ICD-10-CM | POA: Diagnosis not present

## 2024-05-19 DIAGNOSIS — K580 Irritable bowel syndrome with diarrhea: Secondary | ICD-10-CM

## 2024-05-19 DIAGNOSIS — J019 Acute sinusitis, unspecified: Secondary | ICD-10-CM

## 2024-05-19 LAB — POC COVID19 BINAXNOW: SARS Coronavirus 2 Ag: NEGATIVE

## 2024-05-19 LAB — POCT INFLUENZA A/B
Influenza A, POC: NEGATIVE
Influenza B, POC: NEGATIVE

## 2024-05-19 LAB — POCT RAPID STREP A (OFFICE): Rapid Strep A Screen: NEGATIVE

## 2024-05-19 MED ORDER — AZITHROMYCIN 250 MG PO TABS
ORAL_TABLET | ORAL | 0 refills | Status: DC
  Filled 2024-05-19: qty 6, 5d supply, fill #0

## 2024-05-19 NOTE — Progress Notes (Signed)
   Subjective:    Patient ID: Shawn Barajas, male    DOB: 1996-05-09, 28 y.o.   MRN: [id number]  HPI Here for 6 days of sinus congestion, PND, ST, and coughing up yellow sputum. No fever or SOB. This started 2 days after he returned from a trip to the [place].    Review of Systems  Constitutional: Negative.   HENT:  Positive for congestion, postnasal drip, sinus pain and sore throat. Negative for ear pain.   Eyes: Negative.   Respiratory:  Positive for cough. Negative for shortness of breath and wheezing.   Gastrointestinal: Negative.        Objective:   Physical Exam Constitutional:      Appearance: Normal appearance.  HENT:     Right Ear: Tympanic membrane, ear canal and external ear normal.     Left Ear: Tympanic membrane, ear canal and external ear normal.     Nose: Nose normal.     Mouth/Throat:     Pharynx: Oropharynx is clear.  Eyes:     Conjunctiva/sclera: Conjunctivae normal.  Pulmonary:     Effort: Pulmonary effort is normal.     Breath sounds: Normal breath sounds.  Lymphadenopathy:     Cervical: No cervical adenopathy.  Neurological:     Mental Status: He is alert.           Assessment & Plan:  Sinusitis, treat with a Zpack.  Garnette Olmsted, MD

## 2024-05-20 ENCOUNTER — Other Ambulatory Visit (HOSPITAL_BASED_OUTPATIENT_CLINIC_OR_DEPARTMENT_OTHER): Payer: Self-pay

## 2024-05-20 MED ORDER — DICYCLOMINE HCL 10 MG PO CAPS
10.0000 mg | ORAL_CAPSULE | Freq: Three times a day (TID) | ORAL | 2 refills | Status: AC
Start: 2024-05-20 — End: ?
  Filled 2024-05-20 – 2024-07-12 (×2): qty 120, 30d supply, fill #0

## 2024-05-30 ENCOUNTER — Other Ambulatory Visit (HOSPITAL_BASED_OUTPATIENT_CLINIC_OR_DEPARTMENT_OTHER): Payer: Self-pay

## 2024-06-03 ENCOUNTER — Ambulatory Visit (HOSPITAL_BASED_OUTPATIENT_CLINIC_OR_DEPARTMENT_OTHER): Admitting: Family Medicine

## 2024-06-23 ENCOUNTER — Encounter: Payer: Self-pay | Admitting: Radiology

## 2024-07-12 ENCOUNTER — Other Ambulatory Visit (HOSPITAL_BASED_OUTPATIENT_CLINIC_OR_DEPARTMENT_OTHER): Payer: Self-pay

## 2024-07-14 ENCOUNTER — Ambulatory Visit (HOSPITAL_BASED_OUTPATIENT_CLINIC_OR_DEPARTMENT_OTHER): Admitting: Family Medicine

## 2024-07-14 ENCOUNTER — Other Ambulatory Visit (HOSPITAL_COMMUNITY)
Admission: RE | Admit: 2024-07-14 | Discharge: 2024-07-14 | Disposition: A | Discharge status: Home / Self Care | Source: Ambulatory Visit | Attending: Family Medicine | Admitting: Family Medicine

## 2024-07-14 ENCOUNTER — Encounter (HOSPITAL_BASED_OUTPATIENT_CLINIC_OR_DEPARTMENT_OTHER): Payer: Self-pay | Admitting: Family Medicine

## 2024-07-14 ENCOUNTER — Other Ambulatory Visit (HOSPITAL_BASED_OUTPATIENT_CLINIC_OR_DEPARTMENT_OTHER): Payer: Self-pay

## 2024-07-14 ENCOUNTER — Other Ambulatory Visit: Payer: Self-pay

## 2024-07-14 VITALS — BP 128/86 | HR 52 | Ht 69.0 in | Wt 175.0 lb

## 2024-07-14 DIAGNOSIS — R1084 Generalized abdominal pain: Secondary | ICD-10-CM | POA: Diagnosis not present

## 2024-07-14 DIAGNOSIS — Z202 Contact with and (suspected) exposure to infections with a predominantly sexual mode of transmission: Secondary | ICD-10-CM

## 2024-07-14 DIAGNOSIS — K580 Irritable bowel syndrome with diarrhea: Secondary | ICD-10-CM | POA: Diagnosis not present

## 2024-07-14 DIAGNOSIS — K219 Gastro-esophageal reflux disease without esophagitis: Secondary | ICD-10-CM | POA: Diagnosis not present

## 2024-07-14 MED ORDER — DICYCLOMINE HCL 10 MG PO CAPS
10.0000 mg | ORAL_CAPSULE | Freq: Three times a day (TID) | ORAL | 3 refills | Status: AC
  Filled 2024-08-19: qty 120, 30d supply, fill #0

## 2024-07-14 MED ORDER — OMEPRAZOLE 20 MG PO CPDR: 20.0000 mg | DELAYED_RELEASE_CAPSULE | Freq: Every day | ORAL | 3 refills | Status: AC

## 2024-07-14 NOTE — Progress Notes (Signed)
 Subjective:   Shawn Barajas [birth year] 07/14/2024  Chief Complaint  Patient presents with   Gastroesophageal Reflux    Pt states he has been having problems with indigestion as well as gas pains and is stating needs to have a refill of both omeprazole  and dicyclomine . Also wants to have STD testing performed.    Discussed the use of AI scribe software for clinical note transcription with the patient, who gave verbal consent to proceed.  History of Present Illness Shawn Barajas is a 28 year old male who presents with recurrent indigestion and stomach pain.  He has been experiencing recurrent indigestion and stomach pain. He previously had an H. pylori infection earlier in 2025 and was completed abx regimen with clearance per chart review with GI. He was prescribed Bentyl  for help with IBS with diarrhea and abdominal cramping. He stopped taking dicyclomine  a few months ago and was symptom-free for a while, but now reports the return of stomach problems.  Current symptoms include stomach pains and unusual gas, occurring both with and without eating. No nausea, vomiting, or diarrhea. He has bowel movements every other day without any blood or dark stools.  He has run out of his omeprazole  medication and has requested refills for both omeprazole  and dicyclomine . He is unsure if the refills are ready but has put in requests for them.  He inquired about STD testing, although he denies any symptoms or positive exposures. No urinary symptoms such as increased or painful urination.     The following portions of the patient's history were reviewed and updated as appropriate: past medical history, past surgical history, family history, social history, allergies, medications, and problem list.   Patient Active Problem List   Diagnosis Date Noted   Gastroesophageal reflux disease with esophagitis 06/17/2023   Irritable bowel syndrome with diarrhea 06/17/2023   Past Medical History:   Diagnosis Date   COVID    H. pylori infection    Past Surgical History:  Procedure Laterality Date   INGUINAL HERNIA REPAIR     Family History  Problem Relation Age of Onset   Other Mother        hx unknown   Other Father        hx unknown   Outpatient Medications Prior to Visit  Medication Sig Dispense Refill   dicyclomine  (BENTYL ) 10 MG capsule Take 1 capsule (10 mg total) by mouth 4 (four) times daily -  before meals and at bedtime. 120 capsule 2   omeprazole  (PRILOSEC) 20 MG capsule Take 1 capsule (20 mg total) by mouth daily. 90 capsule 2   azithromycin  (ZITHROMAX  Z-PAK) 250 MG tablet Take as directed 6 each 0   No facility-administered medications prior to visit.   No Known Allergies   ROS: A complete ROS was performed with pertinent positives/negatives noted in the HPI. The remainder of the ROS are negative.    Objective:   Today's Vitals   07/14/24 0910  BP: 128/86  Pulse: (!) 52  SpO2: 100%  Weight: 175 lb (79.4 kg)  Height: 5' 9 (1.753 m)    Physical Exam   GENERAL: Well-appearing, in NAD. Well nourished.  SKIN: Pink, warm and dry. No rash, lesion, ulceration, or ecchymoses.  Head: Normocephalic. NECK: Trachea midline. Full ROM w/o pain or tenderness.  RESPIRATORY: Chest wall symmetrical. Respirations even and non-labored.  GI: Abdomen soft, non-tender. Normoactive bowel sounds. No rebound tenderness. No hepatomegaly or splenomegaly. No CVA tenderness.  MSK: Muscle tone and  strength appropriate for age.  NEUROLOGIC: No motor or sensory deficits. Steady, even gait. C2-C12 intact.  PSYCH/MENTAL STATUS: Alert, oriented x 3. Cooperative, appropriate mood and affect.      Assessment & Plan:  1. Gastroesophageal reflux disease, unspecified whether esophagitis present (Primary) Previously controlled on PPI therapy. Will refill Omeprazole .   2. Encounter for assessment of sexually transmitted disease exposure Will obtain labs and urine cytology today.  Asymptomatic.  - Hepatitis C antibody - HIV Antibody (routine testing w rflx) - RPR W/RFLX TO RPR TITER, TREPONEMAL AB, SCREEN AND DIAGNOSIS - Urine cytology ancillary only  3. Irritable bowel syndrome with diarrhea Will refill Bentyl  and safe use reviewed with patient.  - dicyclomine  (BENTYL ) 10 MG capsule; Take 1 capsule (10 mg total) by mouth 4 (four) times daily -  before meals and at bedtime.  Dispense: 120 capsule; Refill: 3  4. Generalized abdominal pain Given hx of H Pylori and food related abd pain, will rule out recurrence of H Pylori with breath testing. He has been off PPI therapy for at least 2 weeks.  - H. pylori breath test  Gastroesophageal reflux disease, unspecified whether esophagitis present  Encounter for assessment of sexually transmitted disease exposure -     Hepatitis C antibody -     HIV Antibody (routine testing w rflx) -     RPR W/RFLX TO RPR TITER, TREPONEMAL AB, SCREEN AND DIAGNOSIS -     Urine cytology ancillary only  Irritable bowel syndrome with diarrhea -     Dicyclomine  HCl; Take 1 capsule (10 mg total) by mouth 4 (four) times daily -  before meals and at bedtime.  Dispense: 120 capsule; Refill: 3  Generalized abdominal pain -     H. pylori breath test  Other orders -     Omeprazole ; Take 1 capsule (20 mg total) by mouth daily.  Dispense: 90 capsule; Refill: 3    Meds ordered this encounter  Medications   dicyclomine  (BENTYL ) 10 MG capsule    Sig: Take 1 capsule (10 mg total) by mouth 4 (four) times daily -  before meals and at bedtime.    Dispense:  120 capsule    Refill:  3    Supervising Provider:   DE CUBA, RAYMOND J [[id number]]   omeprazole  (PRILOSEC) 20 MG capsule    Sig: Take 1 capsule (20 mg total) by mouth daily.    Dispense:  90 capsule    Refill:  3    Supervising Provider:   DE CUBA, RAYMOND J [[id number]]   Lab Orders         Hepatitis C antibody         HIV Antibody (routine testing w rflx)         RPR W/RFLX TO RPR TITER,  TREPONEMAL AB, SCREEN AND DIAGNOSIS         H. pylori breath test     No images are attached to the encounter or orders placed in the encounter.  Return in about 4 months (around 11/11/2024) for ANNUAL PHYSICAL.    Patient to reach out to office if new, worrisome, or unresolved symptoms arise or if no improvement in patient's condition. Patient verbalized understanding and is agreeable to treatment plan. All questions answered to patient's satisfaction.    Thersia Schuyler Stark, [place]

## 2024-07-15 ENCOUNTER — Ambulatory Visit (HOSPITAL_BASED_OUTPATIENT_CLINIC_OR_DEPARTMENT_OTHER): Payer: Self-pay | Admitting: Family Medicine

## 2024-07-15 LAB — H. PYLORI BREATH TEST: H pylori Breath Test: NEGATIVE

## 2024-07-15 LAB — URINE CYTOLOGY ANCILLARY ONLY
Chlamydia: NEGATIVE
Comment: NEGATIVE
Comment: NEGATIVE
Comment: NORMAL
Neisseria Gonorrhea: NEGATIVE
Trichomonas: NEGATIVE

## 2024-07-15 LAB — SYPHILIS: RPR W/REFLEX TO RPR TITER AND TREPONEMAL ANTIBODIES, TRADITIONAL SCREENING AND DIAGNOSIS ALGORITHM: RPR Ser Ql: NONREACTIVE

## 2024-07-15 LAB — HEPATITIS C ANTIBODY: Hep C Virus Ab: NONREACTIVE

## 2024-07-15 LAB — HIV ANTIBODY (ROUTINE TESTING W REFLEX): HIV Screen 4th Generation wRfx: NONREACTIVE

## 2024-07-15 NOTE — Progress Notes (Signed)
 Hep C, HIV and RPR are negative.

## 2024-07-15 NOTE — Progress Notes (Signed)
 Hi Shawn Barajas,  Your H Pylori and STD testing is normal. Please restart your omeprazole  and if further abdominal pain persists, please reach out to the office.

## 2024-08-19 ENCOUNTER — Other Ambulatory Visit (HOSPITAL_BASED_OUTPATIENT_CLINIC_OR_DEPARTMENT_OTHER): Payer: Self-pay

## 2024-08-22 ENCOUNTER — Other Ambulatory Visit (HOSPITAL_BASED_OUTPATIENT_CLINIC_OR_DEPARTMENT_OTHER): Payer: Self-pay

## 2024-08-26 ENCOUNTER — Encounter: Payer: Self-pay | Admitting: Internal Medicine

## 2024-08-26 ENCOUNTER — Ambulatory Visit: Admitting: Internal Medicine

## 2024-08-26 ENCOUNTER — Other Ambulatory Visit (HOSPITAL_COMMUNITY)
Admission: RE | Admit: 2024-08-26 | Discharge: 2024-08-26 | Disposition: A | Discharge status: Home / Self Care | Source: Ambulatory Visit | Attending: Internal Medicine | Admitting: Internal Medicine

## 2024-08-26 VITALS — BP 120/84 | HR 65 | Temp 98.2°F | Wt 174.5 lb

## 2024-08-26 DIAGNOSIS — Z113 Encounter for screening for infections with a predominantly sexual mode of transmission: Secondary | ICD-10-CM

## 2024-08-26 NOTE — Progress Notes (Signed)
" ° ° ° °  Established Patient Office Visit     CC/Reason for Visit: STD screening  HPI: Shawn Barajas is a 29 y.o. male who is coming in today for the above mentioned reasons.  Has a new sexual partner and is requesting STD screening.  He did not use condoms.  Has no symptoms.   Past Medical/Surgical History: Past Medical History:  Diagnosis Date   COVID    H. pylori infection     Past Surgical History:  Procedure Laterality Date   INGUINAL HERNIA REPAIR      Social History:  reports that he has never smoked. He has never used smokeless tobacco. He reports that he does not currently use alcohol. He reports current drug use. Drug: Marijuana.  Allergies: Allergies[1]  Family History:  Family History  Problem Relation Age of Onset   Other Mother        hx unknown   Other Father        hx unknown    Current Medications[2]  Review of Systems:  Negative unless indicated in HPI.   Physical Exam: Vitals:   08/26/24 1509  BP: 120/84  Pulse: 65  Temp: 98.2 F (36.8 C)  TempSrc: Oral  SpO2: 98%  Weight: 174 lb 8 oz (79.2 kg)    Body mass index is 25.77 kg/m.    Impression and Plan:  Screen for STD (sexually transmitted disease) -     Hepatitis panel, acute; Future -     RPR W/RFLX TO RPR TITER, TREPONEMAL AB, SCREEN AND DIAGNOSIS; Future -     HIV Antibody (routine testing w rflx); Future -     Urine cytology ancillary only   - Have discussed safe sexual practices. - Condom samples have been given. - STD labs to be ordered.  Time spent:30 minutes reviewing chart, interviewing and examining patient and formulating plan of care.     Tully Theophilus Andrews, MD [facility]     [1] No Known Allergies [2]  Current Outpatient Medications:    dicyclomine  (BENTYL ) 10 MG capsule, Take 1 capsule (10 mg total) by mouth 4 (four) times daily -  before meals and at bedtime., Disp: 120 capsule, Rfl: 3   omeprazole  (PRILOSEC) 20 MG  capsule, Take 1 capsule (20 mg total) by mouth daily., Disp: 90 capsule, Rfl: 3  "

## 2024-08-27 LAB — HIV ANTIBODY (ROUTINE TESTING W REFLEX)
HIV 1&2 Ab, 4th Generation: NONREACTIVE
HIV FINAL INTERPRETATION: NEGATIVE

## 2024-08-27 LAB — HEPATITIS PANEL, ACUTE
Hep A IgM: NONREACTIVE
Hep B C IgM: NONREACTIVE
Hepatitis B Surface Ag: NONREACTIVE
Hepatitis C Ab: NONREACTIVE

## 2024-08-27 LAB — SYPHILIS: RPR W/REFLEX TO RPR TITER AND TREPONEMAL ANTIBODIES, TRADITIONAL SCREENING AND DIAGNOSIS ALGORITHM: RPR Ser Ql: NONREACTIVE

## 2024-08-28 ENCOUNTER — Ambulatory Visit: Payer: Self-pay | Admitting: Internal Medicine

## 2024-08-28 LAB — URINE CYTOLOGY ANCILLARY ONLY
Chlamydia: NEGATIVE
Comment: NEGATIVE
Comment: NEGATIVE
Comment: NORMAL
Neisseria Gonorrhea: NEGATIVE
Trichomonas: NEGATIVE

## 2024-09-01 ENCOUNTER — Ambulatory Visit: Payer: Self-pay

## 2024-09-01 ENCOUNTER — Encounter (HOSPITAL_BASED_OUTPATIENT_CLINIC_OR_DEPARTMENT_OTHER): Payer: Self-pay | Admitting: Family Medicine

## 2024-09-01 NOTE — Telephone Encounter (Signed)
 FYI Only or Action Required?: Action required by provider: request for appointment.  Patient was last seen in primary care on 08/26/2024 by Theophilus Andrews, Tully GRADE, MD.  Called Nurse Triage reporting Penile Discharge.  Symptoms began a week ago.  Interventions attempted: Nothing.  Symptoms are: unchanged.Penile discharge at night, mild discomfort, mild irritation when voiding. Had STI testing.  Triage Disposition: See Physician Within 24 Hours  Patient/caregiver understands and will follow disposition?: Yes      Copied from CRM [id number]. Topic: Clinical - Red Word Triage >> Sep 01, 2024 10:51 AM Ellena S wrote: [facility] that prompted transfer to Nurse Triage: abnormal discharge >> Sep 01, 2024  3:19 PM Antwanette L wrote: Pt is calling back about discharge. Pt was informed to make an acute visit or uc Reason for Disposition  Pain or burning with passing urine  Answer Assessment - Initial Assessment Questions 1. SYMPTOM: What's the main symptom you're concerned about? (e.g., blood in semen, discharge or pus from penis, itching, pain, rash, swelling)     discharge 2. LOCATION: Where is the  located?     penis 3. ONSET: When did   start?     2 weeks 4. PAIN: Is there any pain? If Yes, ask: How bad is it?  (Scale 1-10; or mild, moderate, severe)     no 5. URINE: Any difficulty passing urine? If Yes, ask: When was the last time?     no 6. CAUSE: What do you think is causing the symptoms?     unsure 7. OTHER SYMPTOMS: Do you have any other symptoms? (e.g., blood in urine, abdomen pain, fever)     no  Protocols used: Penis and Scrotum Symptoms-A-AH

## 2024-09-01 NOTE — Telephone Encounter (Signed)
 FYI Only or Action Required?: Action required by provider: clinical question for provider and update on patient condition.  Patient was last seen in primary care on 08/26/2024 by Theophilus Andrews, Tully GRADE, MD.  Called Nurse Triage reporting Penile Discharge.  Symptoms began a week ago.  Interventions attempted: Nothing.  Symptoms are: unchanged.  Triage Disposition: See PCP When Office is Open (Within 3 Days)  Patient/caregiver understands and will follow disposition?: Yes    Copied from CRM [id number]. Topic: Clinical - Red Word Triage >> Sep 01, 2024 10:51 AM Nessti S wrote: [facility] that prompted transfer to Nurse Triage: abnormal discharge    Reason for Disposition  All other penis - scrotum symptoms  (Exception: Painless rash < 24 hours duration.)  Answer Assessment - Initial Assessment Questions Pt called in to f/u on my chart message from 08/29/24 with Dr. Theophilus Andrews. Pt states he was seen 08/26/24 and STD screening negative but still having discharge from penis that looks like sperm without ejaculation or stimulation. Pt denies wet dreams, no fever, no abdominal pain, no blood in ejaculation or difficulty urination. Pt states he does have minimal itching so he thought maybe jock itch d/t going to gym daily but no rash and itching is intermittent. Pt is available via phone or mychart for f/u. Requesting advice today. Pt denied having urologist or any other provider.     1. SYMPTOM: What's the main symptom you're concerned about? (e.g., blood in semen, discharge or pus from penis, itching, pain, rash, swelling)     Discharge from penis   2. LOCATION: Where is the symptoms located?     Discharge from penis; pt states looks like sperm, only at night x 1-2 weeks   3. ONSET: When did symptoms  start?     Week and half ago   4. PAIN: Is there any pain? If Yes, ask: How bad is it?  (Scale 1-10; or mild, moderate, severe)     No; reports minimal itching  intermittent   5. URINE: Any difficulty passing urine? If Yes, ask: When was the last time?     No   6. CAUSE: What do you think is causing the symptoms?     Unsure; denies wet dreams  7. OTHER SYMPTOMS: Do you have any other symptoms? (e.g., blood in urine, abdomen pain, fever)     No  Protocols used: Penis and Scrotum Symptoms-A-AH

## 2024-09-02 ENCOUNTER — Ambulatory Visit: Admitting: Family Medicine

## 2024-09-02 ENCOUNTER — Other Ambulatory Visit (HOSPITAL_COMMUNITY)
Admission: RE | Admit: 2024-09-02 | Discharge: 2024-09-02 | Disposition: A | Discharge status: Home / Self Care | Source: Ambulatory Visit | Attending: Family Medicine | Admitting: Family Medicine

## 2024-09-02 ENCOUNTER — Encounter: Payer: Self-pay | Admitting: Family Medicine

## 2024-09-02 ENCOUNTER — Other Ambulatory Visit: Payer: Self-pay | Admitting: Family Medicine

## 2024-09-02 VITALS — BP 138/80 | HR 60 | Temp 98.0°F | Resp 16 | Ht 69.0 in | Wt 181.0 lb

## 2024-09-02 DIAGNOSIS — R369 Urethral discharge, unspecified: Secondary | ICD-10-CM

## 2024-09-02 NOTE — Patient Instructions (Signed)
 A few things to remember from today's visit:  Urethral discharge - Plan: Ambulatory referral to Urology, Mycoplasma / Ureaplasma Culture, Urine cytology ancillary only  Appt with urologist will be arranged, if symptoms resolved you can cancel appt.  Do not use My Chart to request refills or for acute issues that need immediate attention. If you send a my chart message, it may take a few days to be addressed, specially if I am not in the office.  Please be sure medication list is accurate. If a new problem present, please set up appointment sooner than planned today.

## 2024-09-02 NOTE — Progress Notes (Signed)
 "  ACUTE VISIT Chief Complaint  Patient presents with   Penile Discharge    X 1.5-2 weeks - Did STD testing and all the test were normal.    Discussed the use of AI scribe software for clinical note transcription with the patient, who gave verbal consent to proceed.  History of Present Illness Shawn Barajas is a 29 year old male with PMHx significant for IBS-D and GERD who presents with a two-week history of penile discharge. He is accompanied by his daughter.  He has experienced penile discharge for the past two weeks, described as white, more noticeable through the night. No associated fever, burning with urination, genital lesions, abdominal pain, or joint pain. Urine flow is normal.  He has a history of chlamydia, having had it twice before. He denies any new sexual partners and states he has only one partner, who is currently pregnant and asymptomatic for discharge.  He was evaluated for this problem on 08/26/24 and underwent STD testing, which was negative for common sexually transmitted infections.  He has not seen his PCP for this problem. States that he knows symptoms are not caused by a STD and seeking definitive Dx and treatment.  He has not used any new detergents,lubricants, or soaps and no hx of trauma He has not used OTC medication.  Review of Systems  Constitutional:  Negative for activity change and appetite change.  HENT:  Negative for mouth sores and sore throat.   Gastrointestinal:  Negative for abdominal pain, nausea and vomiting.  Genitourinary:  Negative for decreased urine volume, dysuria, flank pain and hematuria.  Musculoskeletal:  Negative for joint swelling and myalgias.  Skin:  Negative for rash.  Neurological:  Negative for syncope and weakness.  See other pertinent positives and negatives in HPI.  Medications Ordered Prior to Encounter[1]  Past Medical History:  Diagnosis Date   COVID    H. pylori infection    Allergies[2]  Social History    Socioeconomic History   Marital status: Single    Spouse name: Not on file   Number of children: 1   Years of education: Not on file   Highest education level: 12th grade  Occupational History   Not on file  Tobacco Use   Smoking status: Never   Smokeless tobacco: Never  Vaping Use   Vaping status: Never Used  Substance and Sexual Activity   Alcohol use: Not Currently    Comment: social    Drug use: Yes    Types: Marijuana    Comment: 1-2 per week   Sexual activity: Yes    Birth control/protection: None  Other Topics Concern   Not on file  Social History Narrative   Not on file   Social Drivers of Health   Tobacco Use: Low Risk (09/02/2024)   Patient History    Smoking Tobacco Use: Never    Smokeless Tobacco Use: Never    Passive Exposure: Not on file  Financial Resource Strain: Low Risk (08/18/2023)   Overall Financial Resource Strain (CARDIA)    Difficulty of Paying Living Expenses: Not hard at all  Food Insecurity: No Food Insecurity (08/18/2023)   Hunger Vital Sign    Worried About Running Out of Food in the Last Year: Never true    Ran Out of Food in the Last Year: Never true  Transportation Needs: No Transportation Needs (08/18/2023)   PRAPARE - Administrator, Civil Service (Medical): No    Lack of Transportation (Non-Medical): No  Physical Activity: Inactive (03/15/2024)   Exercise Vital Sign    Days of Exercise per Week: 0 days    Minutes of Exercise per Session: Not on file  Stress: No Stress Concern Present (08/18/2023)   [employer] of Occupational Health - Occupational Stress Questionnaire    Feeling of Stress : Not at all  Social Connections: Unknown (03/15/2024)   Social Connection and Isolation Panel    Frequency of Communication with Friends and Family: Once a week    Frequency of Social Gatherings with Friends and Family: Once a week    Attends Religious Services: More than 4 times per year    Active Member of Clubs or  Organizations: No    Attends Banker Meetings: Not on file    Marital Status: Not on file  Depression (PHQ2-9): Low Risk (03/19/2024)   Depression (PHQ2-9)    PHQ-2 Score: 0  Alcohol Screen: Low Risk (08/18/2023)   Alcohol Screen    Last Alcohol Screening Score (AUDIT): 1  Housing: Low Risk (08/18/2023)   Housing Stability Vital Sign    Unable to Pay for Housing in the Last Year: No    Number of Times Moved in the Last Year: 0    Homeless in the Last Year: No  Utilities: Not on file  Health Literacy: Not on file   Vitals:   09/02/24 1326  BP: 138/80  Pulse: 60  Resp: 16  Temp: 98 F (36.7 C)  SpO2: 98%   Body mass index is 26.73 kg/m.  Physical Exam Vitals and nursing note reviewed.  Constitutional:      General: He is not in acute distress.    Appearance: He is well-developed.  HENT:     Head: Normocephalic and atraumatic.  Eyes:     Conjunctiva/sclera: Conjunctivae normal.  Pulmonary:     Effort: Pulmonary effort is normal. No respiratory distress.  Abdominal:     Palpations: Abdomen is soft. There is no mass.     Tenderness: There is no abdominal tenderness.  Genitourinary:    Pubic Area: No rash.      Penis: Discharge (small amount, whitish) present. No erythema, tenderness, swelling or lesions.      Comments: Declined chaperone. Musculoskeletal:     Right lower leg: No edema.     Left lower leg: No edema.  Lymphadenopathy:     Lower Body: No right inguinal adenopathy. No left inguinal adenopathy.  Skin:    General: Skin is warm.     Findings: No erythema or rash.  Neurological:     General: No focal deficit present.     Mental Status: He is alert and oriented to person, place, and time.     Gait: Gait normal.  Psychiatric:        Mood and Affect: Mood and affect normal.   ASSESSMENT AND PLAN:  Mr. Shawn Barajas was seen today for penile discharge.   Diagnoses and all orders for this visit: Orders Placed This Encounter  Procedures    Mycoplasma / Ureaplasma Culture   Ambulatory referral to Urology   Urethral discharge I was asked to see pt today for urethral discharge, he has already seen another provider and STD screening was negative. He has not seen PCP for this problem. He denies risk factors for STDs, upset when I recommended repeating testing. I recommended trying an urethral swab, reluctant to do so due to potential discomfort with procedure.  After a long discussion about testing methodology, he agrees  with procedure but Q-tip/swab was thick,could not find a thinner one; so we decided to repeat STD testing with urine. Mycoplasma genitalium to be considered, ordering urine.  He is very upset about not knowing what is causing this problem. We discussed possible infectious and noninfectious etiologies. Mycoplasma genitalium testing added to labs. Referral to urology is placed.  -     Ambulatory referral to Urology -     Urine cytology ancillary only -     Mycoplasma / ureaplasma culture  I personally spent a total of 31 minutes in the care of the patient today including preparing to see the patient, getting/reviewing separately obtained history, performing a medically appropriate exam/evaluation, counseling and educating, placing orders, and documenting clinical information in the EHR.  Return if symptoms worsen or fail to improve.  Lydiana Milley G. Shaine Mount, MD  [facility] [place] office.     [1]  Current Outpatient Medications on File Prior to Visit  Medication Sig Dispense Refill   dicyclomine  (BENTYL ) 10 MG capsule Take 1 capsule (10 mg total) by mouth 4 (four) times daily -  before meals and at bedtime. 120 capsule 3   omeprazole  (PRILOSEC) 20 MG capsule Take 1 capsule (20 mg total) by mouth daily. 90 capsule 3   No current facility-administered medications on file prior to visit.  [2] No Known Allergies  "

## 2024-09-03 LAB — URINE CYTOLOGY ANCILLARY ONLY
Chlamydia: NEGATIVE
Comment: NEGATIVE
Comment: NEGATIVE
Comment: NORMAL
Neisseria Gonorrhea: NEGATIVE
Trichomonas: NEGATIVE

## 2024-09-08 ENCOUNTER — Other Ambulatory Visit (HOSPITAL_BASED_OUTPATIENT_CLINIC_OR_DEPARTMENT_OTHER): Payer: Self-pay

## 2024-09-08 ENCOUNTER — Ambulatory Visit: Payer: Self-pay | Admitting: Family Medicine

## 2024-09-08 ENCOUNTER — Other Ambulatory Visit: Payer: Self-pay

## 2024-09-08 ENCOUNTER — Telehealth: Payer: Self-pay

## 2024-09-08 DIAGNOSIS — A493 Mycoplasma infection, unspecified site: Secondary | ICD-10-CM

## 2024-09-08 MED ORDER — DOXYCYCLINE HYCLATE 100 MG PO TABS
100.0000 mg | ORAL_TABLET | Freq: Two times a day (BID) | ORAL | 0 refills | Status: AC
  Filled 2024-09-08: qty 14, 7d supply, fill #0

## 2024-09-08 MED ORDER — DOXYCYCLINE MONOHYDRATE 100 MG PO TABS: 100.0000 mg | ORAL_TABLET | Freq: Two times a day (BID) | ORAL | 0 refills | Status: AC

## 2024-09-08 NOTE — Telephone Encounter (Unsigned)
 Copied from CRM #[id number]. Topic: General - Other >> Sep 08, 2024  3:19 PM Jayma L wrote: Reason for CRM: was on call with patient and line dropped. Asking [facility] meds [facility], needs a callback from veronica

## 2024-09-08 NOTE — Telephone Encounter (Signed)
 Copied from CRM [id number]. Topic: Clinical - Prescription Issue >> Sep 08, 2024  3:29 PM Rea C wrote: Reason for CRM: doxycycline  (VIBRA -TABS) 100 MG tablet [[id number]]- PT called in and said this medication was sent to the wrong pharmacy at [facility]. [facility]   [id number] [place], [place] - [address] [place] [address] Phone: [phone]  Fax: [phone]     Pt most recently had an acute appointment with Dr. Jordan. Lucienne called him last to go over results. Pt would like a phone call back from her to ask some further questions. But, the call was for the presc being sent to the wrong pharmacy. Patient would appreciate a call to let to know it's been sent over.   [phone] BENNIE)

## 2024-09-08 NOTE — Addendum Note (Signed)
 Addended by: VANICE LUCIENNE PARAS on: 09/08/2024 09:29 AM   Modules accepted: Orders

## 2024-09-08 NOTE — Telephone Encounter (Signed)
 Spoke with the patient and the refill has been sent and patient is picking it up.

## 2024-09-08 NOTE — Telephone Encounter (Signed)
 Patient informed and medication has been sent. Patient voiced understanding and he stated he will any sexual partners.

## 2024-09-08 NOTE — Telephone Encounter (Signed)
 [facility] by a provider that is not a teaching laboratory technician at [facility]

## 2024-09-08 NOTE — Telephone Encounter (Signed)
 [id number]

## 2024-09-09 LAB — MYCOPLASMA / UREAPLASMA HOMINIS CULTURE: Ureaplasma urealyticum: POSITIVE — AB

## 2024-09-10 ENCOUNTER — Telehealth: Payer: Self-pay

## 2024-09-10 ENCOUNTER — Other Ambulatory Visit (HOSPITAL_COMMUNITY): Payer: Self-pay

## 2024-09-10 NOTE — Telephone Encounter (Signed)
 Pharmacy Patient Advocate Encounter   Received notification from [facility] that prior authorization for Adoxa is required/requested.   Insurance verification completed.   The patient is insured through [facility] MEDICAID.   Per test claim: PA required; PA submitted to above mentioned insurance via Latent Key/confirmation #/EOC [id number] Status is pending and expedited.

## 2024-09-16 ENCOUNTER — Other Ambulatory Visit (HOSPITAL_COMMUNITY): Payer: Self-pay

## 2024-09-17 ENCOUNTER — Other Ambulatory Visit (HOSPITAL_BASED_OUTPATIENT_CLINIC_OR_DEPARTMENT_OTHER)

## 2024-10-06 ENCOUNTER — Ambulatory Visit (HOSPITAL_BASED_OUTPATIENT_CLINIC_OR_DEPARTMENT_OTHER): Admitting: Family Medicine

## 2024-10-06 ENCOUNTER — Ambulatory Visit: Admitting: Urology
